# Patient Record
Sex: Female | Born: 1935 | Hispanic: Yes | Smoking: Never smoker
Health system: Southern US, Community
[De-identification: ages and names within clinical notes are randomized; demographics above are authoritative.]

## PROBLEM LIST (undated history)

## (undated) DIAGNOSIS — I10 Essential (primary) hypertension: Secondary | ICD-10-CM

## (undated) DIAGNOSIS — E785 Hyperlipidemia, unspecified: Secondary | ICD-10-CM

## (undated) DIAGNOSIS — K219 Gastro-esophageal reflux disease without esophagitis: Secondary | ICD-10-CM

## (undated) HISTORY — PX: ESOPHAGOGASTRODUODENOSCOPY: SHX1529

## (undated) HISTORY — PX: COLONOSCOPY: SHX174

## (undated) HISTORY — DX: Gastro-esophageal reflux disease without esophagitis: K21.9

## (undated) HISTORY — DX: Hyperlipidemia, unspecified: E78.5

---

## 1998-09-04 HISTORY — PX: VAGINA SURGERY: SHX829

## 2011-07-15 ENCOUNTER — Emergency Department (HOSPITAL_COMMUNITY): Payer: Medicare Other

## 2011-07-15 ENCOUNTER — Emergency Department (HOSPITAL_COMMUNITY)
Admission: EM | Admit: 2011-07-15 | Discharge: 2011-07-16 | Disposition: A | Discharge status: Home / Self Care | Payer: Medicare Other | Attending: Emergency Medicine | Admitting: Emergency Medicine

## 2011-07-15 ENCOUNTER — Encounter: Payer: Self-pay | Admitting: *Deleted

## 2011-07-15 DIAGNOSIS — I10 Essential (primary) hypertension: Secondary | ICD-10-CM | POA: Insufficient documentation

## 2011-07-15 DIAGNOSIS — M25469 Effusion, unspecified knee: Secondary | ICD-10-CM | POA: Insufficient documentation

## 2011-07-15 DIAGNOSIS — G8929 Other chronic pain: Secondary | ICD-10-CM | POA: Insufficient documentation

## 2011-07-15 DIAGNOSIS — W1789XA Other fall from one level to another, initial encounter: Secondary | ICD-10-CM | POA: Insufficient documentation

## 2011-07-15 DIAGNOSIS — M25473 Effusion, unspecified ankle: Secondary | ICD-10-CM | POA: Insufficient documentation

## 2011-07-15 DIAGNOSIS — M25476 Effusion, unspecified foot: Secondary | ICD-10-CM | POA: Insufficient documentation

## 2011-07-15 DIAGNOSIS — M25461 Effusion, right knee: Secondary | ICD-10-CM

## 2011-07-15 DIAGNOSIS — E7800 Pure hypercholesterolemia, unspecified: Secondary | ICD-10-CM | POA: Insufficient documentation

## 2011-07-15 HISTORY — DX: Essential (primary) hypertension: I10

## 2011-07-15 MED ORDER — OXYCODONE-ACETAMINOPHEN 5-325 MG PO TABS
1.0000 | ORAL_TABLET | Freq: Once | ORAL | Status: AC
  Administered 2011-07-15: 1 via ORAL

## 2011-07-15 MED ORDER — OXYCODONE-ACETAMINOPHEN 5-325 MG PO TABS
ORAL_TABLET | ORAL | Status: AC
  Filled 2011-07-15: qty 1

## 2011-07-15 NOTE — ED Notes (Signed)
Pt is here with right knee and bilateral heel pain for 7 months and then today fell because her right knee gave out.  No other injury.

## 2011-07-16 MED ORDER — OXYCODONE-ACETAMINOPHEN 5-325 MG PO TABS: 1.0000 | ORAL_TABLET | ORAL | Status: AC | PRN

## 2011-07-16 MED ORDER — OXYCODONE-ACETAMINOPHEN 5-325 MG PO TABS
1.0000 | ORAL_TABLET | Freq: Once | ORAL | Status: AC
  Administered 2011-07-16: 1 via ORAL
  Filled 2011-07-16: qty 1

## 2011-07-16 NOTE — ED Provider Notes (Signed)
We female with fall onto her right knee. Imaging without signs of fracture small joint effusion seen. Knee immobilizer acid, orthopedic referral made.  Is equal exam mild swelling of the right knee, no erythema, warmth or deformity. Normal neurovascular status in pulses to the right foot.  Assessment plan, orthopedic followup, immobilization, walker, anti-inflammatories. I discussed this plan with the family who have communicated to her by translation into her native language issues expressed her understanding.  Medical screening examination/treatment/procedure(s) were conducted as a shared visit with non-physician practitioner(s) and myself.  I personally evaluated the patient during the encounter   Vida Roller, MD 07/16/11 0127

## 2011-07-16 NOTE — ED Notes (Signed)
Family is at the bedside.  Knee immobilizer was applied.  Explained to family how to apply this and to have her wear it until she sees orthopedist

## 2011-07-16 NOTE — ED Provider Notes (Signed)
History     CSN: [id number] Arrival date & time: 07/15/2011  6:17 PM   First MD Initiated Contact with Patient 07/15/11 2338      Chief Complaint  Patient presents with  . Knee Injury    HPI:  Patient is a 76 y.o. female presenting with fall. The history is provided by a relative. The history is limited by a language barrier. A language interpreter was used.  Fall The accident occurred 6 to 12 hours ago. The fall occurred while standing. She fell from a height of 3 to 5 ft. She landed on carpet. The point of impact was the right knee.  Son who speaks very good english reports pt w/ long hx of chronic (R) knee pain. Has had mx cortisone injections for same. Tonight pt went rise from a chair and knee was painful and "gave out" causing pt to fall. Pt reports severe (R) knee pain.  Past Medical History  Diagnosis Date  . Hypertension   . Hypercholesteremia     History reviewed. No pertinent past surgical history.  No family history on file.  History  Substance Use Topics  . Smoking status: Never Smoker   . Smokeless tobacco: Not on file  . Alcohol Use: No    OB History    Grav Para Term Preterm Abortions TAB SAB Ect Mult Living                  Review of Systems  Constitutional: Negative.   HENT: Negative.   Eyes: Negative.   Respiratory: Negative.   Gastrointestinal: Negative.   Genitourinary: Negative.   Musculoskeletal: Positive for joint swelling and arthralgias.  Skin: Negative.   Neurological: Negative.   Hematological: Negative.   Psychiatric/Behavioral: Negative.     Allergies  Penicillins  Home Medications   Current Outpatient Rx  Name Route Sig Dispense Refill  . LOSARTAN POTASSIUM 100 MG PO TABS Oral Take 100 mg by mouth daily.        BP 156/58  Pulse 84  Temp(Src) 98.2 F (36.8 C) (Oral)  Resp 22  SpO2 96%  Physical Exam  Constitutional: She is oriented to person, place, and time. She appears well-developed and well-nourished.  HENT:   Head: Normocephalic and atraumatic.  Eyes: Conjunctivae are normal.  Neck: Normal range of motion.  Cardiovascular: Normal rate.   Pulmonary/Chest: Effort normal.  Musculoskeletal: Normal range of motion.       Right knee: She exhibits swelling and effusion. She exhibits no ecchymosis, no deformity, no laceration and no erythema. tenderness found.       Legs: Neurological: She is alert and oriented to person, place, and time. She has normal reflexes.  Skin: Skin is warm and dry.  Psychiatric: She has a normal mood and affect.    ED Course  Procedures Xr w/o acute findings. Pain has improved w/ medication. Knee immobilizer applied. Will provide Rx's for pain med, walker and ortho referral. Pt and family agreeable w/ plan.  Labs Reviewed - No data to display Dg Knee Complete 4 Views Right  07/15/2011  *RADIOLOGY REPORT*  Clinical Data: Right knee pain.  RIGHT KNEE - COMPLETE 4+ VIEW  Comparison: None  Findings: The joint spaces are fairly well maintained for age. Minimal degenerative changes.  No acute bony findings.  No osteochondral lesions.  Possible small joint effusion.  IMPRESSION: Minimal degenerative changes for age.  No acute bony findings.  Original Report Authenticated By: P. Loralie Champagne, M.D.  MDM    DG Knee Complete 4 Views Right (Final result)   Result time:07/15/11 2026    Final result by Rad Results In Interface (07/15/11 20:26:14)    Narrative:   *RADIOLOGY REPORT*  Clinical Data: Right knee pain.  RIGHT KNEE - COMPLETE 4+ VIEW  Comparison: None  Findings: The joint spaces are fairly well maintained for age. Minimal degenerative changes. No acute bony findings. No osteochondral lesions. Possible small joint effusion.  IMPRESSION: Minimal degenerative changes for age. No acute bony findings.  Original Report Authenticated By: P. Loralie Champagne, M.D           Leanne Chang, NP 07/16/11 [phone]

## 2011-07-31 ENCOUNTER — Other Ambulatory Visit: Payer: Self-pay | Admitting: Orthopedic Surgery

## 2011-07-31 DIAGNOSIS — M25561 Pain in right knee: Secondary | ICD-10-CM

## 2011-08-23 ENCOUNTER — Ambulatory Visit
Admission: RE | Admit: 2011-08-23 | Discharge: 2011-08-23 | Disposition: A | Discharge status: Home / Self Care | Payer: Medicare Other | Source: Ambulatory Visit | Attending: Orthopedic Surgery | Admitting: Orthopedic Surgery

## 2011-08-23 DIAGNOSIS — M25561 Pain in right knee: Secondary | ICD-10-CM

## 2012-06-04 DIAGNOSIS — R070 Pain in throat: Secondary | ICD-10-CM | POA: Insufficient documentation

## 2012-08-13 ENCOUNTER — Other Ambulatory Visit: Payer: Self-pay | Admitting: Otolaryngology

## 2012-08-13 DIAGNOSIS — R1310 Dysphagia, unspecified: Secondary | ICD-10-CM

## 2012-08-22 ENCOUNTER — Ambulatory Visit
Admission: RE | Admit: 2012-08-22 | Discharge: 2012-08-22 | Disposition: A | Discharge status: Home / Self Care | Payer: Medicare Other | Source: Ambulatory Visit | Attending: Otolaryngology | Admitting: Otolaryngology

## 2012-08-22 DIAGNOSIS — R1310 Dysphagia, unspecified: Secondary | ICD-10-CM

## 2012-09-05 DIAGNOSIS — I10 Essential (primary) hypertension: Secondary | ICD-10-CM | POA: Insufficient documentation

## 2012-09-05 DIAGNOSIS — E7800 Pure hypercholesterolemia, unspecified: Secondary | ICD-10-CM | POA: Insufficient documentation

## 2012-09-06 ENCOUNTER — Encounter: Payer: Self-pay | Admitting: *Deleted

## 2012-09-06 ENCOUNTER — Ambulatory Visit (INDEPENDENT_AMBULATORY_CARE_PROVIDER_SITE_OTHER): Payer: Medicare Other | Admitting: Cardiology

## 2012-09-06 ENCOUNTER — Encounter: Payer: Self-pay | Admitting: Cardiology

## 2012-09-06 VITALS — BP 124/70 | HR 81 | Wt 154.0 lb

## 2012-09-06 DIAGNOSIS — K219 Gastro-esophageal reflux disease without esophagitis: Secondary | ICD-10-CM | POA: Insufficient documentation

## 2012-09-06 DIAGNOSIS — E7800 Pure hypercholesterolemia, unspecified: Secondary | ICD-10-CM

## 2012-09-06 DIAGNOSIS — R002 Palpitations: Secondary | ICD-10-CM | POA: Insufficient documentation

## 2012-09-06 DIAGNOSIS — I10 Essential (primary) hypertension: Secondary | ICD-10-CM

## 2012-09-06 DIAGNOSIS — E785 Hyperlipidemia, unspecified: Secondary | ICD-10-CM | POA: Insufficient documentation

## 2012-09-06 LAB — TSH: TSH: 2.83 u[IU]/mL (ref 0.35–5.50)

## 2012-09-06 NOTE — Assessment & Plan Note (Signed)
Continue statin. Management per primary care. 

## 2012-09-06 NOTE — Progress Notes (Signed)
  HPI: 78 year old female with no prior cardiac history for evaluation of palpitations. Patient does not speak [place]. History is obtained through her son. She typically does not have dyspnea on exertion, orthopnea, PND, pedal edema, syncope or exertional chest pain. Over the past week she has had intermittent palpitations. They're described as her heart racing. This typically lasts 5 minutes and resolves spontaneously. No associated symptoms. Because of the above we were asked to evaluate.  Current Outpatient Prescriptions  Medication Sig Dispense Refill  . Esomeprazole Magnesium (NEXIUM PO) Take 1 tablet by mouth daily.      Marland Kitchen losartan (COZAAR) 100 MG tablet Take 100 mg by mouth daily.        . Rosuvastatin Calcium (CRESTOR PO) Take 1 tablet by mouth daily.        Allergies  Allergen Reactions  . Penicillins Nausea And Vomiting    Past Medical History  Diagnosis Date  . Hypertension   . Hyperlipidemia   . GERD (gastroesophageal reflux disease)     No past surgical history on file.  History   Social History  . Marital Status: Legally Separated    Spouse Name: N/A    Number of Children: 5  . Years of Education: N/A   Occupational History  .     Social History Main Topics  . Smoking status: Never Smoker   . Smokeless tobacco: Not on file  . Alcohol Use: No  . Drug Use: No  . Sexually Active:    Other Topics Concern  . Not on file   Social History Narrative  . No narrative on file    Family History  Problem Relation Age of Onset  . Heart disease      No family history    ROS: no fevers or chills, productive cough, hemoptysis, dysphasia, odynophagia, melena, hematochezia, dysuria, hematuria, rash, seizure activity, orthopnea, PND, pedal edema, claudication. Remaining systems are negative.  Physical Exam:   Blood pressure 124/70, pulse 81, weight 154 lb (69.854 kg).  General:  Well developed/well nourished in NAD Skin warm/dry Patient not depressed No  peripheral clubbing Back-normal HEENT-normal/normal eyelids Neck supple/normal carotid upstroke bilaterally; no bruits; no JVD; no thyromegaly chest - CTA/ normal expansion CV - RRR/normal S1 and S2; no rubs or gallops;  PMI nondisplaced, 1/6 systolic murmur left sternal border. Abdomen -NT/ND, no HSM, no mass, + bowel sounds, no bruit 2+ femoral pulses, no bruits Ext-no edema, chords, 2+ DP Neuro-grossly nonfocal  ECG 09/05/2012-sinus rhythm with no ST changes.

## 2012-09-06 NOTE — Patient Instructions (Addendum)
Your physician recommends that you schedule a follow-up appointment in: 6- 8 WEEKS WITH DR CRENSHAW  Your physician has requested that you have an echocardiogram. Echocardiography is a painless test that uses sound waves to create images of your heart. It provides your doctor with information about the size and shape of your heart and how well your heart's chambers and valves are working. This procedure takes approximately one hour. There are no restrictions for this procedure.   Your physician has recommended that you wear an event monitor. Event monitors are medical devices that record the heart's electrical activity. Doctors most often [place] these monitors to diagnose arrhythmias. Arrhythmias are problems with the speed or rhythm of the heartbeat. The monitor is a small, portable device. You can wear one while you do your normal daily activities. This is usually used to diagnose what is causing palpitations/syncope (passing out).   Your physician recommends that you HAVE LAB WORK TODAY

## 2012-09-06 NOTE — Assessment & Plan Note (Signed)
Etiology unclear. Check CardioNet. Check echocardiogram for LV function. Check TSH. If symptoms persist we will consider changing her Cozaar to her beta blocker to control both blood pressure and palpitations.

## 2012-09-06 NOTE — Assessment & Plan Note (Signed)
Blood pressure controlled. Continue present medications. 

## 2012-09-11 ENCOUNTER — Telehealth: Payer: Self-pay | Admitting: *Deleted

## 2012-09-11 NOTE — Telephone Encounter (Signed)
Message copied by Harriet Butte on Wed Sep 11, 2012  8:45 AM ------      Message from: MATHIS, DEBRA W      Created: Tue Sep 10, 2012  3:14 PM       This lady only speaks spanish! Can you please call her when you can and let her know her thyroid is normal based on the lab work we did last week. Thanks so much.

## 2012-09-11 NOTE — Telephone Encounter (Signed)
Called x 2 Phone sound busy.

## 2012-09-11 NOTE — Telephone Encounter (Signed)
Patient's son aware of lab work. Pt and son verbalized understanding.

## 2012-09-13 ENCOUNTER — Encounter: Payer: Self-pay | Admitting: Cardiology

## 2012-09-17 ENCOUNTER — Ambulatory Visit (HOSPITAL_COMMUNITY): Payer: Medicare Other | Attending: Cardiology

## 2012-09-17 ENCOUNTER — Encounter (INDEPENDENT_AMBULATORY_CARE_PROVIDER_SITE_OTHER): Payer: Medicare Other

## 2012-09-17 DIAGNOSIS — I10 Essential (primary) hypertension: Secondary | ICD-10-CM | POA: Insufficient documentation

## 2012-09-17 DIAGNOSIS — R002 Palpitations: Secondary | ICD-10-CM

## 2012-09-17 DIAGNOSIS — I379 Nonrheumatic pulmonary valve disorder, unspecified: Secondary | ICD-10-CM | POA: Insufficient documentation

## 2012-09-17 DIAGNOSIS — I369 Nonrheumatic tricuspid valve disorder, unspecified: Secondary | ICD-10-CM | POA: Insufficient documentation

## 2012-09-17 DIAGNOSIS — I059 Rheumatic mitral valve disease, unspecified: Secondary | ICD-10-CM | POA: Insufficient documentation

## 2012-09-17 NOTE — Progress Notes (Signed)
Echocardiogram performed.  

## 2012-09-17 NOTE — Progress Notes (Unsigned)
Placed a 30 event monitor . Went over instructions with her and her son, also went over instructions on when to return it

## 2012-10-22 ENCOUNTER — Telehealth: Payer: Self-pay | Admitting: *Deleted

## 2012-10-22 NOTE — Telephone Encounter (Signed)
Monitor reviewed by dr crenshaw shows sinus rhythm  

## 2012-10-23 NOTE — Telephone Encounter (Signed)
Pt and son are aware of monitor results. Son verbalized understanding

## 2012-11-01 ENCOUNTER — Encounter: Payer: Self-pay | Admitting: Cardiology

## 2012-11-01 ENCOUNTER — Ambulatory Visit (INDEPENDENT_AMBULATORY_CARE_PROVIDER_SITE_OTHER): Payer: Medicare Other | Admitting: Cardiology

## 2012-11-01 VITALS — BP 150/86 | HR 80 | Wt 152.0 lb

## 2012-11-01 DIAGNOSIS — I10 Essential (primary) hypertension: Secondary | ICD-10-CM

## 2012-11-01 DIAGNOSIS — E7800 Pure hypercholesterolemia, unspecified: Secondary | ICD-10-CM

## 2012-11-01 DIAGNOSIS — R002 Palpitations: Secondary | ICD-10-CM

## 2012-11-01 NOTE — Progress Notes (Signed)
   HPI: Pleasant female I initially saw in January of 2014 for palpitations. Echocardiogram in January of 2014 showed normal LV function, grade 1 diastolic dysfunction and mild mitral regurgitation. Event monitor in January of 2014 showed sinus rhythm. TSH 2.83. Since I last saw her, her palpitations have resolved. There is no chest pain, dyspnea or syncope.   Current Outpatient Prescriptions  Medication Sig Dispense Refill  . esomeprazole (NEXIUM) 40 MG capsule Take 40 mg by mouth daily before breakfast.      . losartan (COZAAR) 100 MG tablet Take 100 mg by mouth daily.        . rosuvastatin (CRESTOR) 20 MG tablet Take 20 mg by mouth daily.       No current facility-administered medications for this visit.     Past Medical History  Diagnosis Date  . Hypertension   . Hyperlipidemia   . GERD (gastroesophageal reflux disease)     History reviewed. No pertinent past surgical history.  History   Social History  . Marital Status: Legally Separated    Spouse Name: N/A    Number of Children: 5  . Years of Education: N/A   Occupational History  .     Social History Main Topics  . Smoking status: Never Smoker   . Smokeless tobacco: Not on file  . Alcohol Use: No  . Drug Use: No  . Sexually Active:    Other Topics Concern  . Not on file   Social History Narrative  . No narrative on file    ROS: no fevers or chills, productive cough, hemoptysis, dysphasia, odynophagia, melena, hematochezia, dysuria, hematuria, rash, seizure activity, orthopnea, PND, pedal edema, claudication. Remaining systems are negative.  Physical Exam: Well-developed well-nourished in no acute distress.  Skin is warm and dry.  HEENT is normal.  Neck is supple.  Chest is clear to auscultation with normal expansion.  Cardiovascular exam is regular rate and rhythm.  Abdominal exam nontender or distended. No masses palpated. Extremities show no edema. neuro grossly intact

## 2012-11-01 NOTE — Assessment & Plan Note (Signed)
Symptoms have resolved. Monitor and echocardiogram normal. TSH normal. We'll not proceed with further workup.

## 2012-11-01 NOTE — Assessment & Plan Note (Signed)
Management per primary care. 

## 2012-11-01 NOTE — Assessment & Plan Note (Signed)
Blood pressure is mildly elevated. She will continue her present medications and followup with her primary care physician. She may need additional medications in the future.

## 2012-11-01 NOTE — Patient Instructions (Addendum)
Your physician recommends that you schedule a follow-up appointment in: AS NEEDED  

## 2013-12-02 ENCOUNTER — Ambulatory Visit (INDEPENDENT_AMBULATORY_CARE_PROVIDER_SITE_OTHER): Payer: Medicare Other | Admitting: Podiatry

## 2013-12-02 ENCOUNTER — Ambulatory Visit (INDEPENDENT_AMBULATORY_CARE_PROVIDER_SITE_OTHER): Payer: Medicare Other

## 2013-12-02 ENCOUNTER — Encounter: Payer: Self-pay | Admitting: Podiatry

## 2013-12-02 VITALS — BP 161/67 | HR 70 | Resp 18 | Ht 61.0 in | Wt 150.0 lb

## 2013-12-02 DIAGNOSIS — M79609 Pain in unspecified limb: Secondary | ICD-10-CM

## 2013-12-02 DIAGNOSIS — M722 Plantar fascial fibromatosis: Secondary | ICD-10-CM

## 2013-12-02 MED ORDER — METHYLPREDNISOLONE (PAK) 4 MG PO TABS: ORAL_TABLET | ORAL | Status: DC

## 2013-12-02 NOTE — Progress Notes (Signed)
   Subjective:    Patient ID: Ariana Roberts, female    DOB: 1934/12/09, 79 y.o.   MRN: [id number]  HPI N heel pain        L B/L heel pain Left > right        D and O initially 2010 but occurred in January 2015 again        C mild, bothersome scratching pain        A walking         T injections received in 2010, pain pills Naproxen     Review of Systems  HENT: Negative.   Eyes: Negative.   Respiratory: Negative.   Cardiovascular: Negative.   Gastrointestinal: Negative.   Endocrine: Negative.   Genitourinary: Negative.   Musculoskeletal: Positive for myalgias.       Pt feels the statin medication was causing the left shoulder and left leg pain.  Skin: Negative.   Allergic/Immunologic: Negative.   Neurological: Negative.   Hematological: Negative.   Psychiatric/Behavioral: Negative.        Objective:   Physical Exam: I have reviewed her past medical history medications allergies surgeries and social history. Pulses are strongly palpable bilateral. Neurologic sensorium is intact per since once the monofilament. Degenerative flexor intact bilateral. Muscle strength is 5 over 5 dorsiflexors plantar flexors inverters everters all intrinsic musculature is intact. Orthopedic evaluation demonstrates all joints distal to the ankle a full range of motion without crepitation. His pain on palpation medial calcaneal tubercle of the bilateral heels. Radiographic evaluation does demonstrate soft tissue increase in density at the plantar fascial calcaneal insertion sites bilateral.        Assessment & Plan:  Assessment: Plantar fasciitis bilateral.  Plan: Injected the bilateral heels today with Kenalog and local anesthetic. Plantar fascial strapping was were applied. She was also provided with a prescription for prednisone.

## 2013-12-30 ENCOUNTER — Ambulatory Visit (INDEPENDENT_AMBULATORY_CARE_PROVIDER_SITE_OTHER): Payer: Medicare Other | Admitting: Podiatry

## 2013-12-30 ENCOUNTER — Encounter: Payer: Self-pay | Admitting: Podiatry

## 2013-12-30 VITALS — BP 157/84 | HR 69 | Resp 12

## 2013-12-30 DIAGNOSIS — M722 Plantar fascial fibromatosis: Secondary | ICD-10-CM

## 2013-12-30 NOTE — Progress Notes (Signed)
She presents today stating that she is doing a lot better still has a little pain and itching in her heels bilaterally.  Objective: Vital signs are stable she is alert and oriented x3. Pulses are palpable bilateral. Pain on palpation medial continued tubercle right greater than left.  Assessment: Plantar fasciitis bilateral.  Plan: Injection bilateral heels today plantar fascial strapping applied followup with me in one month

## 2014-02-12 ENCOUNTER — Encounter: Payer: Self-pay | Admitting: Podiatry

## 2014-02-12 ENCOUNTER — Ambulatory Visit (INDEPENDENT_AMBULATORY_CARE_PROVIDER_SITE_OTHER): Payer: Medicare Other | Admitting: Podiatry

## 2014-02-12 VITALS — BP 137/86 | HR 70 | Resp 16

## 2014-02-12 DIAGNOSIS — M722 Plantar fascial fibromatosis: Secondary | ICD-10-CM

## 2014-02-12 MED ORDER — MELOXICAM 15 MG PO TABS: 15.0000 mg | ORAL_TABLET | Freq: Every day | ORAL | Status: DC

## 2014-02-13 NOTE — Progress Notes (Signed)
She presents today for followup of her plantar fasciitis bilateral. She states that she is approximately 90% better.  Objective: Vital signs are stable she is alert and oriented x3. She has minimal pain on palpation medial continued tubercles bilateral.  Assessment: Plantar fasciitis bilateral.  Plan: Continue all conservative therapies and followup with me with recurrence.

## 2014-06-25 ENCOUNTER — Other Ambulatory Visit: Payer: Self-pay | Admitting: Podiatry

## 2015-07-26 ENCOUNTER — Other Ambulatory Visit: Payer: Self-pay | Admitting: Podiatry

## 2015-07-26 NOTE — Telephone Encounter (Signed)
Pt needs an appt

## 2016-02-07 ENCOUNTER — Other Ambulatory Visit: Payer: Self-pay | Admitting: Orthopedic Surgery

## 2016-02-11 NOTE — Progress Notes (Signed)
Called Dr. Diamantina Providenceean's office, spoke with Selena BattenKim, requested orders for upcoming surgery.

## 2016-02-11 NOTE — Progress Notes (Signed)
Attempted to call pt for same day workup call using [employer].  Interpreter ID [id number].  Called number listed x2, no answer, no message left.

## 2016-02-14 ENCOUNTER — Encounter (HOSPITAL_COMMUNITY): Payer: Self-pay | Admitting: General Practice

## 2016-02-14 ENCOUNTER — Other Ambulatory Visit (HOSPITAL_COMMUNITY): Payer: Self-pay | Admitting: General Practice

## 2016-02-14 MED ORDER — CLINDAMYCIN PHOSPHATE 900 MG/50ML IV SOLN
900.0000 mg | INTRAVENOUS | Status: AC
  Administered 2016-02-15: 900 mg via INTRAVENOUS
  Filled 2016-02-14: qty 50

## 2016-02-14 NOTE — Progress Notes (Signed)
Patient contacted with [employer], interpreter ID [phone].   Patient instructed to [facility] at 1130 on 02/15/16, to be NPO after midnight, and to take Prilosec the morning of surgery with a sip of water.  Patient denies chest pain and shortness of breath on pre-op phone call.

## 2016-02-15 ENCOUNTER — Ambulatory Visit (HOSPITAL_COMMUNITY): Payer: Medicare Other | Admitting: Anesthesiology

## 2016-02-15 ENCOUNTER — Observation Stay (HOSPITAL_COMMUNITY)
Admission: RE | Admit: 2016-02-15 | Discharge: 2016-02-16 | Disposition: A | Discharge status: Home Health | Payer: Medicare Other | Source: Ambulatory Visit | Attending: Orthopedic Surgery | Admitting: Orthopedic Surgery

## 2016-02-15 ENCOUNTER — Encounter (HOSPITAL_COMMUNITY): Payer: Self-pay | Admitting: *Deleted

## 2016-02-15 ENCOUNTER — Encounter (HOSPITAL_COMMUNITY)
Admission: RE | Disposition: A | Discharge status: Home Health | Payer: Self-pay | Source: Ambulatory Visit | Attending: Orthopedic Surgery

## 2016-02-15 DIAGNOSIS — Z880 Allergy status to penicillin: Secondary | ICD-10-CM | POA: Insufficient documentation

## 2016-02-15 DIAGNOSIS — R262 Difficulty in walking, not elsewhere classified: Secondary | ICD-10-CM | POA: Insufficient documentation

## 2016-02-15 DIAGNOSIS — K219 Gastro-esophageal reflux disease without esophagitis: Secondary | ICD-10-CM | POA: Diagnosis not present

## 2016-02-15 DIAGNOSIS — R531 Weakness: Secondary | ICD-10-CM | POA: Insufficient documentation

## 2016-02-15 DIAGNOSIS — M23304 Other meniscus derangements, unspecified medial meniscus, left knee: Principal | ICD-10-CM | POA: Insufficient documentation

## 2016-02-15 DIAGNOSIS — E785 Hyperlipidemia, unspecified: Secondary | ICD-10-CM | POA: Diagnosis not present

## 2016-02-15 DIAGNOSIS — I10 Essential (primary) hypertension: Secondary | ICD-10-CM | POA: Diagnosis not present

## 2016-02-15 DIAGNOSIS — S83249A Other tear of medial meniscus, current injury, unspecified knee, initial encounter: Secondary | ICD-10-CM | POA: Diagnosis present

## 2016-02-15 DIAGNOSIS — S83242A Other tear of medial meniscus, current injury, left knee, initial encounter: Secondary | ICD-10-CM | POA: Diagnosis present

## 2016-02-15 HISTORY — PX: KNEE ARTHROSCOPY WITH MEDIAL MENISECTOMY: SHX5651

## 2016-02-15 LAB — BASIC METABOLIC PANEL
Anion gap: 7 (ref 5–15)
BUN: 17 mg/dL (ref 6–20)
CALCIUM: 9.8 mg/dL (ref 8.9–10.3)
CO2: 27 mmol/L (ref 22–32)
CREATININE: 0.93 mg/dL (ref 0.44–1.00)
Chloride: 105 mmol/L (ref 101–111)
GFR calc Af Amer: >60 mL/min (ref >60)
GFR, EST NON AFRICAN AMERICAN: 57 mL/min — AB (ref >60)
GLUCOSE: 89 mg/dL (ref 65–99)
Potassium: 4.8 mmol/L (ref 3.5–5.1)
SODIUM: 139 mmol/L (ref 135–145)

## 2016-02-15 LAB — CBC
HEMATOCRIT: 40.8 % (ref 36.0–46.0)
Hemoglobin: 12.9 g/dL (ref 12.0–15.0)
MCH: 26.3 pg (ref 26.0–34.0)
MCHC: 31.6 g/dL (ref 30.0–36.0)
MCV: 83.1 fL (ref 78.0–100.0)
Platelets: 212 10*3/uL (ref 150–400)
RBC: 4.91 MIL/uL (ref 3.87–5.11)
RDW: 13.4 % (ref 11.5–15.5)
WBC: 9.1 10*3/uL (ref 4.0–10.5)

## 2016-02-15 SURGERY — ARTHROSCOPY, KNEE, WITH MEDIAL MENISCECTOMY
Anesthesia: General | Site: Knee | Laterality: Left

## 2016-02-15 MED ORDER — NEOSTIGMINE METHYLSULFATE 5 MG/5ML IV SOSY
PREFILLED_SYRINGE | INTRAVENOUS | Status: AC
  Filled 2016-02-15: qty 5

## 2016-02-15 MED ORDER — PROPOFOL 10 MG/ML IV BOLUS
INTRAVENOUS | Status: DC | PRN
  Administered 2016-02-15: 80 mg via INTRAVENOUS

## 2016-02-15 MED ORDER — BUPIVACAINE-EPINEPHRINE (PF) 0.25% -1:200000 IJ SOLN
INTRAMUSCULAR | Status: AC
  Filled 2016-02-15: qty 30

## 2016-02-15 MED ORDER — MORPHINE SULFATE (PF) 4 MG/ML IV SOLN
INTRAVENOUS | Status: DC | PRN
  Administered 2016-02-15: 4 mg via INTRAVENOUS

## 2016-02-15 MED ORDER — SUGAMMADEX SODIUM 200 MG/2ML IV SOLN
INTRAVENOUS | Status: DC | PRN
  Administered 2016-02-15: 200 mg via INTRAVENOUS

## 2016-02-15 MED ORDER — CHLORHEXIDINE GLUCONATE 4 % EX LIQD: 60.0000 mL | Freq: Once | CUTANEOUS | Status: DC

## 2016-02-15 MED ORDER — ROCURONIUM BROMIDE 100 MG/10ML IV SOLN
INTRAVENOUS | Status: DC | PRN
  Administered 2016-02-15: 30 mg via INTRAVENOUS

## 2016-02-15 MED ORDER — ONDANSETRON HCL 4 MG/2ML IJ SOLN
INTRAMUSCULAR | Status: DC | PRN
  Administered 2016-02-15: 4 mg via INTRAVENOUS

## 2016-02-15 MED ORDER — METOCLOPRAMIDE HCL 5 MG/ML IJ SOLN
5.0000 mg | Freq: Three times a day (TID) | INTRAMUSCULAR | Status: DC | PRN
  Administered 2016-02-16 (×2): 10 mg via INTRAVENOUS
  Filled 2016-02-15 (×2): qty 2

## 2016-02-15 MED ORDER — BUPIVACAINE HCL (PF) 0.25 % IJ SOLN
INTRAMUSCULAR | Status: DC | PRN
Start: 2016-02-15 — End: 2016-02-15
  Administered 2016-02-15: 30 mL

## 2016-02-15 MED ORDER — ONDANSETRON HCL 4 MG/2ML IJ SOLN
4.0000 mg | Freq: Four times a day (QID) | INTRAMUSCULAR | Status: DC | PRN
  Administered 2016-02-15 – 2016-02-16 (×2): 4 mg via INTRAVENOUS
  Filled 2016-02-15 (×2): qty 2

## 2016-02-15 MED ORDER — DIPHENHYDRAMINE HCL 25 MG PO TABS
25.0000 mg | ORAL_TABLET | Freq: Four times a day (QID) | ORAL | Status: DC | PRN
  Filled 2016-02-15: qty 1

## 2016-02-15 MED ORDER — LACTATED RINGERS IV SOLN
INTRAVENOUS | Status: DC
  Administered 2016-02-15 (×2): via INTRAVENOUS

## 2016-02-15 MED ORDER — PANTOPRAZOLE SODIUM 40 MG PO TBEC
40.0000 mg | DELAYED_RELEASE_TABLET | Freq: Every day | ORAL | Status: DC
  Administered 2016-02-16: 40 mg via ORAL
  Filled 2016-02-15 (×2): qty 1

## 2016-02-15 MED ORDER — ONDANSETRON HCL 4 MG/2ML IJ SOLN
INTRAMUSCULAR | Status: AC
  Filled 2016-02-15: qty 4

## 2016-02-15 MED ORDER — ACETAMINOPHEN 325 MG PO TABS: 650.0000 mg | ORAL_TABLET | Freq: Four times a day (QID) | ORAL | Status: DC | PRN

## 2016-02-15 MED ORDER — ACETAMINOPHEN 650 MG RE SUPP: 650.0000 mg | Freq: Four times a day (QID) | RECTAL | Status: DC | PRN

## 2016-02-15 MED ORDER — LOSARTAN POTASSIUM-HCTZ 100-25 MG PO TABS: 1.0000 | ORAL_TABLET | Freq: Every day | ORAL | Status: DC

## 2016-02-15 MED ORDER — FENTANYL CITRATE (PF) 250 MCG/5ML IJ SOLN
INTRAMUSCULAR | Status: AC
  Filled 2016-02-15: qty 5

## 2016-02-15 MED ORDER — ONDANSETRON HCL 4 MG PO TABS: 4.0000 mg | ORAL_TABLET | Freq: Four times a day (QID) | ORAL | Status: DC | PRN

## 2016-02-15 MED ORDER — FENTANYL CITRATE (PF) 100 MCG/2ML IJ SOLN
INTRAMUSCULAR | Status: DC | PRN
  Administered 2016-02-15 (×2): 50 ug via INTRAVENOUS

## 2016-02-15 MED ORDER — HYDROCODONE-ACETAMINOPHEN 5-325 MG PO TABS
1.0000 | ORAL_TABLET | Freq: Four times a day (QID) | ORAL | Status: DC | PRN
  Administered 2016-02-16: 1 via ORAL
  Filled 2016-02-15: qty 1

## 2016-02-15 MED ORDER — CLONIDINE HCL (ANALGESIA) 100 MCG/ML EP SOLN
EPIDURAL | Status: DC | PRN
  Administered 2016-02-15: .8 mL

## 2016-02-15 MED ORDER — METOCLOPRAMIDE HCL 5 MG PO TABS: 5.0000 mg | ORAL_TABLET | Freq: Three times a day (TID) | ORAL | Status: DC | PRN

## 2016-02-15 MED ORDER — EZETIMIBE 10 MG PO TABS
10.0000 mg | ORAL_TABLET | Freq: Every day | ORAL | Status: DC
  Administered 2016-02-16: 10 mg via ORAL
  Filled 2016-02-15 (×2): qty 1

## 2016-02-15 MED ORDER — MORPHINE SULFATE (PF) 4 MG/ML IV SOLN
INTRAVENOUS | Status: AC
  Filled 2016-02-15: qty 2

## 2016-02-15 MED ORDER — LOSARTAN POTASSIUM 50 MG PO TABS
100.0000 mg | ORAL_TABLET | Freq: Every day | ORAL | Status: DC
  Administered 2016-02-16: 100 mg via ORAL
  Filled 2016-02-15: qty 2

## 2016-02-15 MED ORDER — SODIUM CHLORIDE 0.9 % IR SOLN
Status: DC | PRN
  Administered 2016-02-15: 1000 mL

## 2016-02-15 MED ORDER — CELECOXIB 200 MG PO CAPS
200.0000 mg | ORAL_CAPSULE | Freq: Two times a day (BID) | ORAL | Status: DC
  Administered 2016-02-16: 200 mg via ORAL
  Filled 2016-02-15 (×2): qty 1

## 2016-02-15 MED ORDER — PROPOFOL 10 MG/ML IV BOLUS
INTRAVENOUS | Status: AC
  Filled 2016-02-15: qty 20

## 2016-02-15 MED ORDER — GLYCOPYRROLATE 0.2 MG/ML IV SOSY
PREFILLED_SYRINGE | INTRAVENOUS | Status: AC
  Filled 2016-02-15: qty 3

## 2016-02-15 MED ORDER — POTASSIUM CHLORIDE IN NACL 20-0.9 MEQ/L-% IV SOLN
INTRAVENOUS | Status: DC
  Administered 2016-02-16: 01:00:00 via INTRAVENOUS
  Filled 2016-02-15: qty 1000

## 2016-02-15 MED ORDER — LIDOCAINE HCL (CARDIAC) 20 MG/ML IV SOLN
INTRAVENOUS | Status: DC | PRN
  Administered 2016-02-15: 60 mg via INTRAVENOUS

## 2016-02-15 MED ORDER — HYDROCHLOROTHIAZIDE 25 MG PO TABS
25.0000 mg | ORAL_TABLET | Freq: Every day | ORAL | Status: DC
  Administered 2016-02-16: 25 mg via ORAL
  Filled 2016-02-15: qty 1

## 2016-02-15 MED ORDER — FENTANYL CITRATE (PF) 100 MCG/2ML IJ SOLN: 25.0000 ug | INTRAMUSCULAR | Status: DC | PRN

## 2016-02-15 MED ORDER — ASPIRIN 325 MG PO TABS
325.0000 mg | ORAL_TABLET | Freq: Every day | ORAL | Status: DC
  Administered 2016-02-16: 325 mg via ORAL
  Filled 2016-02-15 (×2): qty 1

## 2016-02-15 SURGICAL SUPPLY — 56 items
ALCOHOL 70% 16 OZ (MISCELLANEOUS) ×3 IMPLANT
BANDAGE ELASTIC 6 VELCRO ST LF (GAUZE/BANDAGES/DRESSINGS) IMPLANT
BANDAGE ESMARK 6X9 LF (GAUZE/BANDAGES/DRESSINGS) IMPLANT
BLADE CUTTER GATOR 3.5 (BLADE) ×3 IMPLANT
BLADE GREAT WHITE 4.2 (BLADE) IMPLANT
BLADE GREAT WHITE 4.2MM (BLADE)
BLADE SURG ROTATE 9660 (MISCELLANEOUS) IMPLANT
BNDG ESMARK 6X9 LF (GAUZE/BANDAGES/DRESSINGS)
COVER SURGICAL LIGHT HANDLE (MISCELLANEOUS) ×3 IMPLANT
CUFF TOURNIQUET SINGLE 34IN LL (TOURNIQUET CUFF) ×3 IMPLANT
CUFF TOURNIQUET SINGLE 44IN (TOURNIQUET CUFF) IMPLANT
DRAPE ARTHROSCOPY W/POUCH 114 (DRAPES) ×3 IMPLANT
DRAPE INCISE IOBAN 66X45 STRL (DRAPES) IMPLANT
DRAPE PROXIMA HALF (DRAPES) IMPLANT
DRAPE U-SHAPE 47X51 STRL (DRAPES) ×3 IMPLANT
DRSG TEGADERM 2-3/8X2-3/4 SM (GAUZE/BANDAGES/DRESSINGS) ×3 IMPLANT
DURAPREP 26ML APPLICATOR (WOUND CARE) ×3 IMPLANT
GAUZE SPONGE 4X4 12PLY STRL (GAUZE/BANDAGES/DRESSINGS) IMPLANT
GAUZE XEROFORM 1X8 LF (GAUZE/BANDAGES/DRESSINGS) ×3 IMPLANT
GLOVE BIO SURGEON STRL SZ 6.5 (GLOVE) ×2 IMPLANT
GLOVE BIO SURGEONS STRL SZ 6.5 (GLOVE) ×1
GLOVE BIOGEL PI IND STRL 7.5 (GLOVE) ×1 IMPLANT
GLOVE BIOGEL PI IND STRL 8 (GLOVE) ×1 IMPLANT
GLOVE BIOGEL PI INDICATOR 7.5 (GLOVE) ×2
GLOVE BIOGEL PI INDICATOR 8 (GLOVE) ×2
GLOVE ECLIPSE 7.0 STRL STRAW (GLOVE) ×3 IMPLANT
GLOVE SURG ORTHO 8.0 STRL STRW (GLOVE) ×3 IMPLANT
GOWN STRL REUS W/ TWL LRG LVL3 (GOWN DISPOSABLE) ×2 IMPLANT
GOWN STRL REUS W/ TWL XL LVL3 (GOWN DISPOSABLE) ×1 IMPLANT
GOWN STRL REUS W/TWL LRG LVL3 (GOWN DISPOSABLE) ×4
GOWN STRL REUS W/TWL XL LVL3 (GOWN DISPOSABLE) ×2
KIT BASIN OR (CUSTOM PROCEDURE TRAY) ×3 IMPLANT
KIT ROOM TURNOVER OR (KITS) ×3 IMPLANT
MANIFOLD NEPTUNE II (INSTRUMENTS) IMPLANT
NEEDLE 18GX1X1/2 (RX/OR ONLY) (NEEDLE) IMPLANT
NEEDLE 22X1 1/2 (OR ONLY) (NEEDLE) ×3 IMPLANT
NS IRRIG 1000ML POUR BTL (IV SOLUTION) IMPLANT
PACK ARTHROSCOPY DSU (CUSTOM PROCEDURE TRAY) ×3 IMPLANT
PAD ARMBOARD 7.5X6 YLW CONV (MISCELLANEOUS) ×6 IMPLANT
PADDING CAST COTTON 6X4 STRL (CAST SUPPLIES) ×3 IMPLANT
SET ARTHROSCOPY TUBING (MISCELLANEOUS) ×2
SET ARTHROSCOPY TUBING LN (MISCELLANEOUS) ×1 IMPLANT
SOL PREP POV-IOD 4OZ 10% (MISCELLANEOUS) ×3 IMPLANT
SPONGE LAP 4X18 X RAY DECT (DISPOSABLE) ×3 IMPLANT
SUT ETHILON 3 0 PS 1 (SUTURE) IMPLANT
SUT MENISCAL KIT (KITS) IMPLANT
SYR 20ML ECCENTRIC (SYRINGE) ×3 IMPLANT
SYR CONTROL 10ML LL (SYRINGE) IMPLANT
SYR TB 1ML LUER SLIP (SYRINGE) ×3 IMPLANT
TOWEL OR 17X24 6PK STRL BLUE (TOWEL DISPOSABLE) ×3 IMPLANT
TOWEL OR 17X26 10 PK STRL BLUE (TOWEL DISPOSABLE) ×3 IMPLANT
TUBE CONNECTING 12'X1/4 (SUCTIONS) ×1
TUBE CONNECTING 12X1/4 (SUCTIONS) ×2 IMPLANT
WAND 90 DEG TURBOVAC W/CORD (SURGICAL WAND) IMPLANT
WAND HAND CNTRL MULTIVAC 90 (MISCELLANEOUS) ×3 IMPLANT
WATER STERILE IRR 1000ML POUR (IV SOLUTION) ×3 IMPLANT

## 2016-02-15 NOTE — Anesthesia Postprocedure Evaluation (Signed)
Anesthesia Post Note  Patient: Ariana Roberts  Procedure(s) Performed: Procedure(s) (LRB): LEFT KNEE ARTHROSCOPY WITH MEDIAL MENISECTOMY (Left)  Patient location during evaluation: PACU Anesthesia Type: General Level of consciousness: awake and alert Pain management: pain level controlled Vital Signs Assessment: post-procedure vital signs reviewed and stable Respiratory status: spontaneous breathing, nonlabored ventilation and respiratory function stable Cardiovascular status: blood pressure returned to baseline and stable Postop Assessment: no signs of nausea or vomiting Anesthetic complications: no    Last Vitals:  Filed Vitals:   02/15/16 1753 02/15/16 1807  BP: 132/87 135/60  Pulse: 70 62  Temp:    Resp: 17 19    Last Pain:  Filed Vitals:   02/15/16 1808  PainSc: 2                  Dashanna Kinnamon,W. EDMOND

## 2016-02-15 NOTE — Progress Notes (Signed)
Pt could not be placed in CPM in PACU because she was on a stretcher. Ortho tech paged upon arrival to floor. Channing MuttersRoy will be up  to place CPM.

## 2016-02-15 NOTE — H&P (Addendum)
Ariana Roberts is an 81 y.o. female.   Chief Complaint: left knee pain HPI: Ariana Roberts is a 81 year old patient with long history of left knee pain.  She has no arthritis on plain radiographs but MRI scan shows medial meniscal tear.  She is failed conservative management including injection and medication activity.  She really can't go on with the knee the way it is.  She does not have a stress fracture by MRI scanning.  Past Medical History  Diagnosis Date  . Hypertension   . Hyperlipidemia   . GERD (gastroesophageal reflux disease)     Past Surgical History  Procedure Laterality Date  . Vagina surgery  2000  . Colonoscopy    . Esophagogastroduodenoscopy      Family History  Problem Relation Age of Onset  . Heart disease      No family history   Social History:  reports that she has never smoked. She does not have any smokeless tobacco history on file. She reports that she does not drink alcohol or use illicit drugs.  Allergies:  Allergies  Allergen Reactions  . Penicillins Nausea And Vomiting    Has patient had a PCN reaction causing immediate rash, facial/tongue/throat swelling, SOB or lightheadedness with hypotension: No Has patient had a PCN reaction causing severe rash involving mucus membranes or skin necrosis: No Has patient had a PCN reaction that required hospitalization No Has patient had a PCN reaction occurring within the last 10 years: No If all of the above answers are "NO", then may proceed with Cephalosporin use.     No prescriptions prior to admission    No results found for this or any previous visit (from the past 48 hour(s)). No results found.  Review of Systems  Constitutional: Negative.   HENT: Negative.   Eyes: Negative.   Respiratory: Negative.   Cardiovascular: Negative.   Gastrointestinal: Negative.   Genitourinary: Negative.   Musculoskeletal: Positive for joint pain.  Skin: Negative.   Neurological: Negative.   Endo/Heme/Allergies:  Negative.   Psychiatric/Behavioral: Negative.     There were no vitals taken for this visit. Physical Exam  Constitutional: She appears well-developed.  HENT:  Head: Normocephalic.  Eyes: Pupils are equal, round, and reactive to light.  Neck: Normal range of motion.  Cardiovascular: Normal rate.   Neurological: She is alert.  Skin: Skin is warm.  Psychiatric: She has a normal mood and affect.   examination the left knee demonstrates an effusion medial joint line tenderness palpable pedal pulses intact since mechanism stable collateral crucial ligaments no groin pain interlocks rotation leg no other masses limitations incision of the left knee region  Assessment/Plan Impression is left knee medial meniscal tear by MRI scanning with no evidence of stress fracture or significant arthritis in the knee.  Patient's failed conservative management and presents now for operative management after expiration risks and benefits.  The nature of surgery in this patient population is discussed with the patient and her son.  Unpredictable results with arthroscopy and meniscal work is a definite possibility in this particular patient.  Incomplete pain relief is the rule and not the exception.  Patient understands the risks and benefits of surgery wishes to proceed.  All questions answered  Cammy CopaEAN,York Valliant SCOTT, MD 02/15/2016, 7:37 AM

## 2016-02-15 NOTE — Brief Op Note (Signed)
02/15/2016  5:17 PM  PATIENT:  Dewayne ShorterAna Baus  81 y.o. female  PRE-OPERATIVE DIAGNOSIS:  LEFT KNEE MEDIAL MENISCAL TEAR  POST-OPERATIVE DIAGNOSIS:  LEFT KNEE MEDIAL MENISCAL TEAR  PROCEDURE:  Procedure(s): LEFT KNEE ARTHROSCOPY WITH MEDIAL MENISECTOMY  SURGEON:  Surgeon(s): Cammy CopaScott Emmanuell Kantz, MD  ASSISTANT: Patrick Jupiterarla Bethune rnfa  ANESTHESIA:   general  EBL: 3 ml       BLOOD ADMINISTERED: none  DRAINS: none   LOCAL MEDICATIONS USED:  Marcaine mso4 clonodine  SPECIMEN:  No Specimen  COUNTS:  YES  TOURNIQUET:    DICTATION: .Other Dictation: Dictation Number [id number]  PLAN OF CARE: Admit for overnight observation  PATIENT DISPOSITION:  PACU - hemodynamically stable

## 2016-02-15 NOTE — Anesthesia Procedure Notes (Signed)
Procedure Name: Intubation Date/Time: 02/15/2016 4:24 PM Performed by: Marena ChancyBECKNER, Aren Pryde S Pre-anesthesia Checklist: Emergency Drugs available, Patient identified, Timeout performed, Suction available and Patient being monitored Patient Re-evaluated:Patient Re-evaluated prior to inductionOxygen Delivery Method: Circle system utilized Preoxygenation: Pre-oxygenation with 100% oxygen Intubation Type: IV induction Ventilation: Mask ventilation without difficulty and Oral airway inserted - appropriate to patient size Laryngoscope Size: Mac and 3 Grade View: Grade I Tube type: Oral Tube size: 7.0 mm Number of attempts: 1 Placement Confirmation: ETT inserted through vocal cords under direct vision,  positive ETCO2 and breath sounds checked- equal and bilateral Tube secured with: Tape Dental Injury: Teeth and Oropharynx as per pre-operative assessment

## 2016-02-15 NOTE — Progress Notes (Signed)
Full dentures ret to pt by son

## 2016-02-15 NOTE — Anesthesia Preprocedure Evaluation (Signed)
Anesthesia Evaluation  Patient identified by MRN, date of birth, ID band Patient awake    Reviewed: Allergy & Precautions, H&P , NPO status , Patient's Chart, lab work & pertinent test results  Airway Mallampati: II  TM Distance: >3 FB Neck ROM: Full    Dental no notable dental hx. (+) Upper Dentures, Lower Dentures, Dental Advisory Given   Pulmonary neg pulmonary ROS,    Pulmonary exam normal breath sounds clear to auscultation       Cardiovascular hypertension, Pt. on medications  Rhythm:Regular Rate:Normal     Neuro/Psych negative neurological ROS  negative psych ROS   GI/Hepatic Neg liver ROS, GERD  Medicated and Controlled,  Endo/Other  negative endocrine ROS  Renal/GU negative Renal ROS  negative genitourinary   Musculoskeletal   Abdominal   Peds  Hematology negative hematology ROS (+)   Anesthesia Other Findings   Reproductive/Obstetrics negative OB ROS                             Anesthesia Physical Anesthesia Plan  ASA: II  Anesthesia Plan: General   Post-op Pain Management:    Induction: Intravenous  Airway Management Planned: LMA and Oral ETT  Additional Equipment:   Intra-op Plan:   Post-operative Plan: Extubation in OR  Informed Consent: I have reviewed the patients History and Physical, chart, labs and discussed the procedure including the risks, benefits and alternatives for the proposed anesthesia with the patient or authorized representative who has indicated his/her understanding and acceptance.   Dental advisory given  Plan Discussed with: CRNA  Anesthesia Plan Comments:         Anesthesia Quick Evaluation

## 2016-02-15 NOTE — Progress Notes (Signed)
Orthopedic Tech Progress Note Patient Details:  Ariana Roberts 11/15/1934 [id number] Applied CPM to LLE. CPM Left Knee CPM Left Knee: On Left Knee Flexion (Degrees): 60 Left Knee Extension (Degrees): 0   Lesle ChrisGilliland, Karon Heckendorn L 02/15/2016, 7:39 PM

## 2016-02-15 NOTE — Transfer of Care (Signed)
Immediate Anesthesia Transfer of Care Note  Patient: Ariana ShorterAna Roberts  Procedure(s) Performed: Procedure(s) with comments: LEFT KNEE ARTHROSCOPY WITH MEDIAL MENISECTOMY (Left) - LEFT KNEE DIAGNOSTIC OPERATIVE ARHTROSCOPY, DEBRIDEMENT.  Patient Location: PACU  Anesthesia Type:General  Level of Consciousness: awake, alert , oriented and patient cooperative  Airway & Oxygen Therapy: Patient Spontanous Breathing and Patient connected to nasal cannula oxygen  Post-op Assessment: Report given to RN, Post -op Vital signs reviewed and stable and Patient moving all extremities  Post vital signs: Reviewed and stable  Last Vitals:  Filed Vitals:   02/15/16 1134  BP: 187/51  Pulse: 76  Temp: 36.9 C  Resp: 18    Last Pain: There were no vitals filed for this visit.    Patients Stated Pain Goal: 4 (02/15/16 1334)  Complications: No apparent anesthesia complications

## 2016-02-15 NOTE — Op Note (Signed)
Ariana Roberts:  Ariana Roberts, Ariana Roberts                  ACCOUNT NO.:  [id number]  MEDICAL RECORD NO.:  [id number]  LOCATION:  [id number]                        FACILITY:  [facility]  PHYSICIAN:  Burnard BuntingG. Scott Dean, M.D.    DATE OF BIRTH:  1934-11-12  DATE OF PROCEDURE: DATE OF DISCHARGE:                              OPERATIVE REPORT   PREOPERATIVE DIAGNOSIS:  Left knee medial meniscal tear.  POSTOPERATIVE DIAGNOSIS:  Left knee medial meniscal tear.  PROCEDURE:  Left knee arthroscopy with partial posterior medial meniscectomy.  SURGEON:  Burnard BuntingG. Scott Dean, M.D.  ASSISTANT:  Patrick Jupiterarla Bethune, RNFA.  INDICATIONS:  Ariana Roberts is a patient with left knee pain refractory to nonoperative management.  Has MRI scan, evidence of medial meniscal tear, no evidence of stress fracture or stress reaction, has focal medial pain, presents now for operative management after explanation of risks and benefits.  PROCEDURE IN DETAIL:  The patient was brought to the operating room where general anesthetic was induced.  Preoperative antibiotics were administered.  Time-out was called.  The patient's left knee was examined under anesthesia, found to have full range of motion, stable collateral and cruciate ligaments, full range of motion.  No posterolateral rotatory instability.  Time-out was called.  Left leg was prescrubbed with alcohol and Betadine, allowed to air dry, prepped with DuraPrep solution and draped in a sterile manner.  Anterior inferolateral portals were established.  Anterior inferomedial portals were established under direct visualization.  Diagnostic arthroscopy was performed.  The patient had intact lateral compartment, articular cartilage and meniscus, intact patellofemoral compartment, intact ACL, PCL.  Some synovitis present in the joint.  The patient did have a pretty significant medial meniscal tear, but with intact articular cartilage on the femur and tibia.  The meniscal tear involved about 90% anterior-posterior width  of the meniscus.  It was debrided back to stable rims with combination of basket, punch and shavers.  Thorough irrigation was then performed, instruments were removed.  Portals were closed using 3-0 nylon.  Solution of Marcaine, morphine, clonidine injected into the knee.  Ace wrap applied.  The patient will be admitted 23-hour observation for therapy and ambulation, weightbearing as tolerated.     Burnard BuntingG. Scott Dean, M.D.     GSD/MEDQ  D:  02/15/2016  T:  02/15/2016  Job:  [id number]

## 2016-02-16 ENCOUNTER — Encounter (HOSPITAL_COMMUNITY): Payer: Self-pay | Admitting: Orthopedic Surgery

## 2016-02-16 DIAGNOSIS — M23304 Other meniscus derangements, unspecified medial meniscus, left knee: Secondary | ICD-10-CM | POA: Diagnosis not present

## 2016-02-16 MED ORDER — HYDROCODONE-ACETAMINOPHEN 5-325 MG PO TABS: 1.0000 | ORAL_TABLET | Freq: Four times a day (QID) | ORAL | Status: DC | PRN

## 2016-02-16 MED ORDER — ASPIRIN 325 MG PO TABS: 325.0000 mg | ORAL_TABLET | Freq: Every day | ORAL | Status: DC

## 2016-02-16 NOTE — Care Management Obs Status (Signed)
MEDICARE OBSERVATION STATUS NOTIFICATION   Patient Details  Name: Ariana Roberts MRN: [id number] Date of Birth: Mar 25, 1935   Medicare Observation Status Notification Given:  Yes    Durenda GuthrieBrady, Tilton Marsalis Naomi, RN 02/16/2016, 4:01 PM

## 2016-02-16 NOTE — Care Management Note (Signed)
Case Management Note  Patient Details  Name: Ariana Roberts MRN: [id number] Date of Birth: [birth year]  Subjective/Objective:   81 yr old female s/p left knee arthroscopy, with meniscectomy                 Action/Plan: Case manager spoke with patient [name] concerning home health and DME needs. Choice was offered for Home Health Agencies. Referral was called to Tamala BariMary Manley, [facility] . Patient resides with her son, will have family support at discharge.    Expected Discharge Date:   02/16/16               Expected Discharge Plan:   home with Home Health  In-House Referral:     Discharge planning Services  CM Consult  Post Acute Care Choice:  Durable Medical Equipment, Home Health Choice offered to:  Patient, Adult Children  DME Arranged:  Walker rolling DME Agency:  [facility]  [facility]  Status of Service:  Completed, signed off  Medicare Important Message Given:    Date Medicare IM Given:    Medicare IM give by:    Date Additional Medicare IM Given:    Additional Medicare Important Message give by:     If discussed at Long Length of Stay Meetings, dates discussed:    Additional Comments:  Durenda GuthrieBrady, Gerardo Caiazzo Naomi, RN 02/16/2016, 11:44 AM

## 2016-02-16 NOTE — Evaluation (Signed)
Physical Therapy Evaluation Patient Details Name: Ariana Roberts MRN: [id number] DOB: [birth year] Today's Date: 02/16/2016   History of Present Illness  Antony Contrasnna Auble is a 81 year old patient with long history of left knee pain. She has no arthritis on plain radiographs but MRI scan shows medial meniscal tear. PMH: GERD, HTN  Clinical Impression  Patient is s/p above surgery resulting in functional limitations due to the deficits listed below (see PT Problem List).  Patient will benefit from skilled PT to increase their independence and safety with mobility to allow discharge to the venue listed below.  Pt at supervision/ min-guard A level, ambulated 9075' with RW. Son seems somewhat uncomfortable with motivating pt to mobilize and he reports that their relationship is somewhat strained. She would benefit from staying the night and having one more session of therapy tomorrow. But if this is not possible, recommend home with HHPT this afternoon. It would also be helpful to have CPM at home that she could be in while son is working.      Follow Up Recommendations Home health PT    Equipment Recommendations  Rolling walker with 5" wheels (youth height)    Recommendations for Other Services       Precautions / Restrictions Precautions Precautions: Fall Restrictions Weight Bearing Restrictions: Yes LLE Weight Bearing: Weight bearing as tolerated      Mobility  Bed Mobility Overal bed mobility: Needs Assistance Bed Mobility: Supine to Sit     Supine to sit: Supervision     General bed mobility comments: pt able to get to EOB with supervision and vc's for sequencing. Encouraged her son to let her do as much as she can on her own at home  Transfers Overall transfer level: Needs assistance Equipment used: Rolling walker (2 wheeled) Transfers: Sit to/from Stand Sit to Stand: Min guard         General transfer comment: vc's for hand placement, min-guard A for  safety  Ambulation/Gait Ambulation/Gait assistance: Min guard Ambulation Distance (Feet): 75 Feet Assistive device: Rolling walker (2 wheeled) Gait Pattern/deviations: Step-through pattern;Decreased step length - left;Decreased weight shift to left Gait velocity: decreased Gait velocity interpretation: <1.8 ft/sec, indicative of risk for recurrent falls General Gait Details: decreased left knee flexion during swing through and flat foot strike, educated pt and son on proper gait pattern to work towards  [employer]            Wheelchair Mobility    Modified Rankin (Stroke Patients Only)       Balance Overall balance assessment: Needs assistance Sitting-balance support: Feet supported;No upper extremity supported Sitting balance-Leahy Scale: Good     Standing balance support: Bilateral upper extremity supported Standing balance-Leahy Scale: Poor Standing balance comment: reliant on RW                             Pertinent Vitals/Pain Pain Assessment: 0-10 Pain Score: 4  Pain Location: left knee Pain Descriptors / Indicators: Grimacing Pain Intervention(s): Limited activity within patient's tolerance;Premedicated before session;Monitored during session;Repositioned    Home Living Family/patient expects to be discharged to:: Private residence Living Arrangements: Children Available Help at Discharge: Family;Available PRN/intermittently Type of Home: House Home Access: Level entry     Home Layout: One level Home Equipment: Cane - single point Additional Comments: pt lives with one son but son that is in room reports that he is taking her home to his house because he can be there more and  help her rehab better. He is hoping that when her rehab is finished, she can go to [place]    Prior Function Level of Independence: Independent with assistive device(s)         Comments: SPC     Hand Dominance        Extremity/Trunk  Assessment   Upper Extremity Assessment: Overall WFL for tasks assessed           Lower Extremity Assessment: LLE deficits/detail   LLE Deficits / Details: quad 2+/5, hip flex 2/5  Cervical / Trunk Assessment: Normal  Communication   Communication: Prefers language other than English (Spanish)  Cognition Arousal/Alertness: Awake/alert Behavior During Therapy: WFL for tasks assessed/performed Overall Cognitive Status: Within Functional Limits for tasks assessed (per her son)                      General Comments General comments (skin integrity, edema, etc.): educated pt on activity level upon return home, use of CPM and the importance of ambulation and ROM/ strengthening exercises. Son seemed to indicate that this may be challenging at home to get her to mobilize regularly    Exercises General Exercises - Lower Extremity Ankle Circles/Pumps: AROM;Both;10 reps;Seated Quad Sets: AROM;Both;10 reps;Supine Long Arc Quad: AROM;Left;10 reps;Seated Heel Slides: AROM;Left;10 reps;Supine Straight Leg Raises: AAROM;Left;10 reps;Supine      Assessment/Plan    PT Assessment Patient needs continued PT services  PT Diagnosis Difficulty walking;Abnormality of gait;Acute pain   PT Problem List Decreased strength;Decreased range of motion;Decreased activity tolerance;Decreased balance;Decreased mobility;Decreased knowledge of use of DME;Decreased knowledge of precautions;Pain  PT Treatment Interventions DME instruction;Gait training;Functional mobility training;Therapeutic activities;Therapeutic exercise;Balance training;Patient/family education   PT Goals (Current goals can be found in the Care Plan section) Acute Rehab PT Goals Patient Stated Goal: return home PT Goal Formulation: With patient/family Time For Goal Achievement: 02/23/16 Potential to Achieve Goals: Good    Frequency Min 5X/week   Barriers to discharge Decreased caregiver support family will be with her most of  the time but not all the time. Feel that there are some cultural barriers as well in that family seems uncomfortable making her get up    Co-evaluation               End of Session Equipment Utilized During Treatment: Gait belt Activity Tolerance: Patient tolerated treatment well Patient left: in chair;with call bell/phone within reach;with family/visitor present Nurse Communication: Mobility status    Functional Assessment Tool Used: clinical judgement Functional Limitation: Mobility: Walking and moving around Mobility: Walking and Moving Around Current Status (G9562): At least 1 percent but less than 20 percent impaired, limited or restricted Mobility: Walking and Moving Around Goal Status [phone]): 0 percent impaired, limited or restricted    Time: 0920-0955 PT Time Calculation (min) (ACUTE ONLY): 35 min   Charges:   PT Evaluation $PT Eval Low Complexity: 1 Procedure PT Treatments $Gait Training: 8-22 mins   PT G Codes:   PT G-Codes **NOT FOR INPATIENT CLASS** Functional Assessment Tool Used: clinical judgement Functional Limitation: Mobility: Walking and moving around Mobility: Walking and Moving Around Current Status (V7846): At least 1 percent but less than 20 percent impaired, limited or restricted Mobility: Walking and Moving Around Goal Status [phone]): 0 percent impaired, limited or restricted  Lyanne Co, PT  Acute Rehab Services  [phone]   Lyanne Co 02/16/2016, 11:00 AM

## 2016-02-16 NOTE — Progress Notes (Signed)
Orthopedic Tech Progress Note Patient Details:  Ariana Roberts 08/02/1935 [id number]  Patient ID: Ariana ShorterAna Peyton, female   DOB: 08/02/1935, 81 y.o.   MRN: [id number] Applied cpm 0-60  Trinna PostMartinez, Suleima Ohlendorf J 02/16/2016, 5:27 AM

## 2016-02-16 NOTE — Progress Notes (Signed)
Subjective: Pt stable   Objective: Vital signs in last 24 hours: Temp:  [97.8 F (36.6 C)-98.5 F (36.9 C)] 98 F (36.7 C) (06/14 0500) Pulse Rate:  [59-76] 59 (06/14 0500) Resp:  [11-19] 17 (06/14 0500) BP: (114-187)/(51-88) 127/57 mmHg (06/14 0500) SpO2:  [95 %-100 %] 95 % (06/14 0500) Weight:  [65.772 kg (145 lb)] 65.772 kg (145 lb) (06/13 1134)  Intake/Output from previous day: 06/13 0701 - 06/14 0700 In: 1290 [P.O.:40; I.V.:1250] Out: 25 [Blood:25] Intake/Output this shift:    Exam:  Sensation intact distally Intact pulses distally  Labs:  Recent Labs  02/15/16 1308  HGB 12.9    Recent Labs  02/15/16 1308  WBC 9.1  RBC 4.91  HCT 40.8  PLT 212    Recent Labs  02/15/16 1308  NA 139  K 4.8  CL 105  CO2 27  BUN 17  CREATININE 0.93  GLUCOSE 89  CALCIUM 9.8   No results for input(s): LABPT, INR in the last 72 hours.  Assessment/Plan: Plan dc today after pt   Bennett Vanscyoc SCOTT 02/16/2016, 7:36 AM

## 2016-02-18 NOTE — Discharge Summary (Signed)
Physician Discharge Summary  Patient ID: Ariana Roberts MRN: [id number] DOB/AGE: 1935/03/12 81 y.o.  Admit date: 02/15/2016 Discharge date: 02/16/2016  Admission Diagnoses:  Active Problems:   Acute medial meniscal tear   Discharge Diagnoses:  Same  Surgeries: Procedure(s): LEFT KNEE ARTHROSCOPY WITH MEDIAL MENISECTOMY on 02/15/2016   Consultants:    Discharged Condition: Stable  Hospital Course: Ariana Roberts is an 81 y.o. female who was admitted 02/15/2016 with a chief complaint of left knee pain, and found to have a diagnosis of left knee meniscal tear with minimal arthritis.  They were brought to the operating room on 02/15/2016 and underwent the above named procedures. She tolerated the arthroscopy well. Started on CPM machine as well as physical therapy for mobilization. Discharge home postop day #1 in good condition. Pain was controlled. Weightbearing as tolerated and encouraged with range of motion exercises and quad strengthening discussed by therapy   Antibiotics given:  Anti-infectives    Start     Dose/Rate Route Frequency Ordered Stop   02/15/16 1330  clindamycin (CLEOCIN) IVPB 900 mg     900 mg 100 mL/hr over 30 Minutes Intravenous To [facility] 02/14/16 1405 02/15/16 1627    .  Recent vital signs:  Filed Vitals:   02/16/16 0500 02/16/16 1000  BP: 127/57 122/52  Pulse: 59   Temp: 98 F (36.7 C)   Resp: 17     Recent laboratory studies:  Results for orders placed or performed during the hospital encounter of 02/15/16  Basic metabolic panel  Result Value Ref Range   Sodium 139 135 - 145 mmol/L   Potassium 4.8 3.5 - 5.1 mmol/L   Chloride 105 101 - 111 mmol/L   CO2 27 22 - 32 mmol/L   Glucose, Bld 89 65 - 99 mg/dL   BUN 17 6 - 20 mg/dL   Creatinine, Ser 4.090.93 0.44 - 1.00 mg/dL   Calcium 9.8 8.9 - 81.110.3 mg/dL   GFR calc non Af Amer 57 (L) >60 mL/min   GFR calc Af Amer >60 >60 mL/min   Anion gap 7 5 - 15  CBC  Result Value Ref Range   WBC 9.1 4.0 -  10.5 K/uL   RBC 4.91 3.87 - 5.11 MIL/uL   Hemoglobin 12.9 12.0 - 15.0 g/dL   HCT 91.440.8 78.236.0 - 95.646.0 %   MCV 83.1 78.0 - 100.0 fL   MCH 26.3 26.0 - 34.0 pg   MCHC 31.6 30.0 - 36.0 g/dL   RDW 21.313.4 08.611.5 - 57.815.5 %   Platelets 212 150 - 400 K/uL    Discharge Medications:     Medication List    TAKE these medications        aspirin 325 MG tablet  Take 1 tablet (325 mg total) by mouth daily.     diphenhydrAMINE 25 MG tablet  Commonly known as:  BENADRYL  Take 25 mg by mouth every 6 (six) hours as needed for allergies.     ezetimibe 10 MG tablet  Commonly known as:  ZETIA  Take 10 mg by mouth daily.     HYDROcodone-acetaminophen 5-325 MG tablet  Commonly known as:  NORCO/VICODIN  Take 1 tablet by mouth every 6 (six) hours as needed (breakthrough pain).     losartan-hydrochlorothiazide 100-25 MG tablet  Commonly known as:  HYZAAR  Take 1 tablet by mouth daily.     omeprazole 40 MG capsule  Commonly known as:  PRILOSEC  Take 40 mg by mouth daily.  Diagnostic Studies: No results found.  Disposition: 06-Home-Health Care Svc      Discharge Instructions    Call MD / Call 911    Complete by:  As directed   If you experience chest pain or shortness of breath, CALL 911 and be transported to the hospital emergency room.  If you develope a fever above 101 F, pus (white drainage) or increased drainage or redness at the wound, or calf pain, call your surgeon's office.     Constipation Prevention    Complete by:  As directed   Drink plenty of fluids.  Prune juice may be helpful.  You may use a stool softener, such as Colace (over the counter) 100 mg twice a day.  Use MiraLax (over the counter) for constipation as needed.     Diet - low sodium heart healthy    Complete by:  As directed      Discharge instructions    Complete by:  As directed   Weight bearing as tolerated with crutches or walker Bend knee as much as possible Ok to shower with waterproof dressings      Increase activity slowly as tolerated    Complete by:  As directed            Follow-up Information    Follow up with [facility].   Specialty:  Home Health Services   Why:  someone from [facility] willo contact you to arrange start date and time for therapy   Contact information:   [address] [place] [address] [phone]        Signed: Cammy Copa 02/18/2016, 4:43 PM

## 2017-02-06 ENCOUNTER — Ambulatory Visit: Payer: Medicare Other | Admitting: Podiatry

## 2017-02-08 ENCOUNTER — Ambulatory Visit (INDEPENDENT_AMBULATORY_CARE_PROVIDER_SITE_OTHER): Payer: Medicare Other

## 2017-02-08 ENCOUNTER — Ambulatory Visit (INDEPENDENT_AMBULATORY_CARE_PROVIDER_SITE_OTHER): Payer: Medicare Other | Admitting: Podiatry

## 2017-02-08 ENCOUNTER — Encounter: Payer: Self-pay | Admitting: Podiatry

## 2017-02-08 DIAGNOSIS — M722 Plantar fascial fibromatosis: Secondary | ICD-10-CM

## 2017-02-08 NOTE — Progress Notes (Signed)
She presents today with her son after having not seen her in some time. She is complaining of right heel pain once again. She states that the last time she was here 3 years ago gave her injections in her heels did very well until just the other week.  Objective: Vital signs are stable she is alert and oriented 3 no change in her past medical history medications or allergies no change in her neurovascular status. She has pain on palpation medial calcaneal tubercle of the right heel.  Assessment: Pain in limb secondary to plantar fasciitis right heel.  Plan: I injected the right heel today N plantar fascia Ariana Roberts will follow up with her in 1 month's discussed proper shoe gear stretching exercises and ice therapy.

## 2017-02-28 DIAGNOSIS — K219 Gastro-esophageal reflux disease without esophagitis: Secondary | ICD-10-CM | POA: Insufficient documentation

## 2017-02-28 DIAGNOSIS — J3089 Other allergic rhinitis: Secondary | ICD-10-CM | POA: Insufficient documentation

## 2017-03-16 ENCOUNTER — Other Ambulatory Visit (INDEPENDENT_AMBULATORY_CARE_PROVIDER_SITE_OTHER): Payer: Self-pay | Admitting: Orthopedic Surgery

## 2017-03-16 NOTE — Telephone Encounter (Signed)
Ok to rf? 

## 2017-03-22 ENCOUNTER — Ambulatory Visit (INDEPENDENT_AMBULATORY_CARE_PROVIDER_SITE_OTHER): Payer: Medicare Other | Admitting: Podiatry

## 2017-03-22 ENCOUNTER — Encounter: Payer: Self-pay | Admitting: Podiatry

## 2017-03-22 DIAGNOSIS — M722 Plantar fascial fibromatosis: Secondary | ICD-10-CM | POA: Diagnosis not present

## 2017-03-22 NOTE — Progress Notes (Signed)
She presents today for follow-up of pain to her right heel. She presents with her son who is an interpreter and states that her heel is doing much much better and she has no pain.  Objective: Vital signs are stable she is alert and oriented 3 pulses are palpable. No Pain. She is no reproducible pain on palpation of medial calcaneal tubercle of the right heel.  Assessment: Well-healing plantar fasciitis right.  Plan: Follow up with me on an as-needed basis or with regression.

## 2017-05-10 ENCOUNTER — Ambulatory Visit (INDEPENDENT_AMBULATORY_CARE_PROVIDER_SITE_OTHER): Payer: Medicare Other | Admitting: Podiatry

## 2017-05-10 ENCOUNTER — Ambulatory Visit: Payer: Medicare Other

## 2017-05-10 DIAGNOSIS — M722 Plantar fascial fibromatosis: Secondary | ICD-10-CM | POA: Diagnosis not present

## 2017-05-10 NOTE — Progress Notes (Signed)
She presents today for follow-up of her plantar fasciitis on her right heel. States that the left one is really not bothering her.  Objective: Vital signs are stable she is alert and oriented 3. Pulses are palpable. Neurologic sensorium is intact. Deep tendon reflexes are intact muscle strength is normal. His pain on palpation medial calcaneal tubercle of the right heel.  Assessment: Chronic intractable plantar fasciitis right heel.  Plan: I injected the right heel today with Kenalog and local anesthetic. Follow-up with her in the near future for surgical intervention possibly.

## 2017-06-06 ENCOUNTER — Ambulatory Visit (INDEPENDENT_AMBULATORY_CARE_PROVIDER_SITE_OTHER): Payer: Medicare Other

## 2017-06-06 ENCOUNTER — Ambulatory Visit (INDEPENDENT_AMBULATORY_CARE_PROVIDER_SITE_OTHER): Payer: Medicare Other | Admitting: Orthopedic Surgery

## 2017-06-06 ENCOUNTER — Encounter (INDEPENDENT_AMBULATORY_CARE_PROVIDER_SITE_OTHER): Payer: Self-pay | Admitting: Orthopedic Surgery

## 2017-06-06 DIAGNOSIS — M5441 Lumbago with sciatica, right side: Secondary | ICD-10-CM

## 2017-06-06 DIAGNOSIS — M25551 Pain in right hip: Secondary | ICD-10-CM | POA: Diagnosis not present

## 2017-06-06 MED ORDER — METHYLPREDNISOLONE 4 MG PO TABS: ORAL_TABLET | ORAL | 0 refills | Status: DC

## 2017-06-06 MED ORDER — BACLOFEN 10 MG PO TABS: 10.0000 mg | ORAL_TABLET | Freq: Two times a day (BID) | ORAL | 0 refills | Status: AC

## 2017-06-06 NOTE — Progress Notes (Signed)
Office Visit Note   Patient: Ariana Roberts           Date of Birth: [birth year]           MRN: [id number] Visit Date: 06/06/2017 Requested by: Marva Panda, NP [facility] [address] [place], [place] [address] PCP: Marva Panda, NP  Subjective: Chief Complaint  Patient presents with  . Lower Back - Pain  . Right Leg - Pain    HPI: Ariana Roberts is an 82 year old female with right hip leg and back pain.  It has been going on since 05/27/2017.  Pain is constant and worse with activity.  She feels better when she is sitting down.  She ambulates with a cane.  The pain does not wake her from sleep.  She denies any groin pain.  She denies any numbness and tingling.  She has tried Modic without relief.  Pain radiates from the buttock on the right-hand side down to the calf.              ROS: All systems reviewed are negative as they relate to the chief complaint within the history of present illness.  Patient denies  fevers or chills.   Assessment & Plan: Visit Diagnoses:  1. Acute midline low back pain with right-sided sciatica   2. Pain in right hip     Plan: Impression is right sided back pain with radiation down the leg.  Radiographs show facet arthritis.  Likely she has a little bit of nerve compression on the right-hand side.  I'm not try her on a Medrol Dosepak for 6 days plus muscle relaxer.  Come back in 4 weeks for clinical recheck and decision then for or against MRI scanning and injections  Follow-Up Instructions: Return in about 4 weeks (around 07/04/2017).   Orders:  Orders Placed This Encounter  Procedures  . XR Lumbar Spine 2-3 Views  . XR Pelvis 1-2 Views   Meds ordered this encounter  Medications  . methylPREDNISolone (MEDROL) 4 MG tablet    Sig: Take dose pak as directed    Dispense:  21 tablet    Refill:  0  . baclofen (LIORESAL) 10 MG tablet    Sig: Take 1 tablet (10 mg total) by mouth 2 (two) times daily.    Dispense:  30 each   Refill:  0      Procedures: No procedures performed   Clinical Data: No additional findings.  Objective: Vital Signs: There were no vitals taken for this visit.  Physical Exam:   Constitutional: Patient appears well-developed HEENT:  Head: Normocephalic Eyes:EOM are normal Neck: Normal range of motion Cardiovascular: Normal rate Pulmonary/chest: Effort normal Neurologic: Patient is alert Skin: Skin is warm Psychiatric: Patient has normal mood and affect    Ortho Exam: Orthopedic exam demonstrates slightly antalgic gait to the right but no nerve retention signs on the right or left side.  Ankle dorsiflexion plantar flexion quite hamstring strength is symmetric and intact.  There is no groin pain with internal/external rotation of the leg.  No other masses lymph adenopathy or skin changes noted in the leg region bilaterally.  Pedal pulses and symmetric reflexes are present.  No paresthesias L1-S1 bilaterally  Specialty Comments:  No specialty comments available.  Imaging: Xr Lumbar Spine 2-3 Views  Result Date: 06/06/2017 AP lateral lumbar spine reviewed.  Slight scoliosis is present.  Facet arthritis is noted distally in the lower lumbar segments.  Hip joints appear well maintained without arthritis.  Remainder bony pelvis normal.  Xr Pelvis 1-2 Views  Result Date: 06/06/2017 AP pelvis reviewed.  Minimal degenerative joint changes noted in the right hip.  Left hip appears normal.  No fracture or unusual bony lesions seen.    PMFS History: Patient Active Problem List   Diagnosis Date Noted  . Acute medial meniscal tear 02/15/2016  . Palpitations   . Hyperlipidemia   . GERD (gastroesophageal reflux disease)   . Hypertension   . Hypercholesteremia    Past Medical History:  Diagnosis Date  . GERD (gastroesophageal reflux disease)   . Hyperlipidemia   . Hypertension     Family History  Problem Relation Age of Onset  . Heart disease Unknown        No family  history    Past Surgical History:  Procedure Laterality Date  . COLONOSCOPY    . ESOPHAGOGASTRODUODENOSCOPY    . KNEE ARTHROSCOPY WITH MEDIAL MENISECTOMY Left 02/15/2016   Procedure: LEFT KNEE ARTHROSCOPY WITH MEDIAL MENISECTOMY;  Surgeon: Cammy Copa, MD;  Location: [facility] OR;  Service: Orthopedics;  Laterality: Left;  LEFT KNEE DIAGNOSTIC OPERATIVE ARHTROSCOPY, DEBRIDEMENT.  Marland Kitchen VAGINA SURGERY  2000   Social History   Occupational History  .  Retired   Social History Main Topics  . Smoking status: Never Smoker  . Smokeless tobacco: Never Used  . Alcohol use No  . Drug use: No  . Sexual activity: Not on file

## 2017-07-25 ENCOUNTER — Ambulatory Visit (INDEPENDENT_AMBULATORY_CARE_PROVIDER_SITE_OTHER): Payer: Medicare Other | Admitting: Orthopedic Surgery

## 2017-07-25 ENCOUNTER — Encounter (INDEPENDENT_AMBULATORY_CARE_PROVIDER_SITE_OTHER): Payer: Self-pay | Admitting: Orthopedic Surgery

## 2017-07-25 DIAGNOSIS — M5441 Lumbago with sciatica, right side: Secondary | ICD-10-CM | POA: Diagnosis not present

## 2017-07-25 NOTE — Progress Notes (Signed)
Office Visit Note   Patient: Ariana Roberts           Date of Birth: December 18, 1934           MRN: [id number] Visit Date: 07/25/2017 Requested by: Marva PandaMillsaps, Kimberly, NP [facility] [address] [place], [place] [id number] PCP: Marva PandaMillsaps, Kimberly, NP  Subjective: Chief Complaint  Patient presents with  . Lower Back - Follow-up    HPI: Ariana Bastosnna is an 82 year old with low back and right hip pain.  She has had 1 epidural steroid injection.  She also has known arthritis in the knees and she's doing reasonably well with that.  She's here with her son today.  She reports occasional back pain on the right-hand side with prolonged walking but in general she's doing reasonably well.  Not currently taking any medication for any of these problems in terms of anti-inflammatories.              ROS: All systems reviewed are negative as they relate to the chief complaint within the history of present illness.  Patient denies  fevers or chills.   Assessment & Plan: Visit Diagnoses:  1. Acute midline low back pain with right-sided sciatica     Plan: Impression is improvement in low back symptoms with 1 epidural started injection.  Knees are currently stable.  I would favor direct referral to Dr. Alvester MorinNewton for further issues with the back when they should recur.  That can be done in terms of epidural steroid injection with Dr. Alvester MorinNewton in the future.  For now she is doing recently well with all of her degenerative issues.  I'll see her back as needed  Follow-Up Instructions: Return if symptoms worsen or fail to improve.   Orders:  No orders of the defined types were placed in this encounter.  No orders of the defined types were placed in this encounter.     Procedures: No procedures performed   Clinical Data: No additional findings.  Objective: Vital Signs: There were no vitals taken for this visit.  Physical Exam:   Constitutional: Patient appears well-developed HEENT:  Head:  Normocephalic Eyes:EOM are normal Neck: Normal range of motion Cardiovascular: Normal rate Pulmonary/chest: Effort normal Neurologic: Patient is alert Skin: Skin is warm Psychiatric: Patient has normal mood and affect    Ortho Exam: Orthopedic exam demonstrates full active and passive range of motion of both ankles knees and hips.  No groin pain with internal rotation of the leg.  Patient has palpable pedal pulses.  No other masses lymph adenopathy or skin changes noted in the right hip or leg region.  Neither knee has an effusion.  She does have some right heel tenderness but this is being treated by a podiatrist  Specialty Comments:  No specialty comments available.  Imaging: No results found.   PMFS History: Patient Active Problem List   Diagnosis Date Noted  . Acute medial meniscal tear 02/15/2016  . Palpitations   . Hyperlipidemia   . GERD (gastroesophageal reflux disease)   . Hypertension   . Hypercholesteremia    Past Medical History:  Diagnosis Date  . GERD (gastroesophageal reflux disease)   . Hyperlipidemia   . Hypertension     Family History  Problem Relation Age of Onset  . Heart disease Unknown        No family history    Past Surgical History:  Procedure Laterality Date  . COLONOSCOPY    . ESOPHAGOGASTRODUODENOSCOPY    . KNEE  ARTHROSCOPY WITH MEDIAL MENISECTOMY Left 02/15/2016   Procedure: LEFT KNEE ARTHROSCOPY WITH MEDIAL MENISECTOMY;  Surgeon: Cammy CopaScott Pricilla Moehle, MD;  Location: [facility] OR;  Service: Orthopedics;  Laterality: Left;  LEFT KNEE DIAGNOSTIC OPERATIVE ARHTROSCOPY, DEBRIDEMENT.  Marland Kitchen. VAGINA SURGERY  2000   Social History   Occupational History    Employer: RETIRED  Tobacco Use  . Smoking status: Never Smoker  . Smokeless tobacco: Never Used  Substance and Sexual Activity  . Alcohol use: No  . Drug use: No  . Sexual activity: Not on file

## 2017-08-02 ENCOUNTER — Ambulatory Visit (INDEPENDENT_AMBULATORY_CARE_PROVIDER_SITE_OTHER): Payer: Medicare Other | Admitting: Podiatry

## 2017-08-02 ENCOUNTER — Encounter: Payer: Self-pay | Admitting: Podiatry

## 2017-08-02 DIAGNOSIS — M722 Plantar fascial fibromatosis: Secondary | ICD-10-CM

## 2017-08-02 NOTE — Progress Notes (Signed)
She presents today with her son states that her foot is not doing any better and she is ready to have something done to it. I have reviewed her past medical history medications and allergies. She denies any other allergies other than penicillin.  Objective: Vital signs are stable alert and oriented 3 pulses are strong and palpable. Severe pain on palpation medial tubercle of the right heel.  Assessment: Plantar fasciitis right heel.  Plan: We discussed etiology pathology conservative versus surgical therapies today. At this point she would like to consider surgical intervention consisting of an endoscopic plantar fasciotomy. With help of her son and interpretation we discussed the pros and cons of surgery as well as the possible postop complications which may include but are not limited to postop pain bleeding swelling infection recurrence and need for further surgery overcorrection under correction. We also dispensed a cam walker and discussed the surgery center and anesthesia. I will follow-up with her in the near future for surgical intervention.

## 2017-08-02 NOTE — Patient Instructions (Signed)
Pre-Operative Instructions  Congratulations, you have decided to take an important step towards improving your quality of life.  You can be assured that the doctors and staff at [facility] will be with you every step of the way.  Here are some important things you should know:  1. Plan to be at the surgery center/hospital at least 1 (one) hour prior to your scheduled time, unless otherwise directed by the surgical center/hospital staff.  You must have a responsible adult accompany you, remain during the surgery and drive you home.  Make sure you have directions to the surgical center/hospital to ensure you arrive on time. 2. If you are having surgery at [facility] or [facility] hospitals, you will need a copy of your medical history and physical form from your family physician within one month prior to the date of surgery. We will give you a form for your primary physician to complete.  3. We make every effort to accommodate the date you request for surgery.  However, there are times where surgery dates or times have to be moved.  We will contact you as soon as possible if a change in schedule is required.   4. No aspirin/ibuprofen for one week before surgery.  If you are on aspirin, any non-steroidal anti-inflammatory medications (Mobic, Aleve, Ibuprofen) should not be taken seven (7) days prior to your surgery.  You make take Tylenol for pain prior to surgery.  5. Medications - If you are taking daily heart and blood pressure medications, seizure, reflux, allergy, asthma, anxiety, pain or diabetes medications, make sure you notify the surgery center/hospital before the day of surgery so they can tell you which medications you should take or avoid the day of surgery. 6. No food or drink after midnight the night before surgery unless directed otherwise by surgical center/hospital staff. 7. No alcoholic beverages 24-hours prior to surgery.  No smoking 24-hours prior or 24-hours after  surgery. 8. Wear loose pants or shorts. They should be loose enough to fit over bandages, boots, and casts. 9. Don't wear slip-on shoes. Sneakers are preferred. 10. Bring your boot with you to the surgery center/hospital.  Also bring crutches or a walker if your physician has prescribed it for you.  If you do not have this equipment, it will be provided for you after surgery. 11. If you have not been contacted by the surgery center/hospital by the day before your surgery, call to confirm the date and time of your surgery. 12. Leave-time from work may vary depending on the type of surgery you have.  Appropriate arrangements should be made prior to surgery with your employer. 13. Prescriptions will be provided immediately following surgery by your doctor.  Fill these as soon as possible after surgery and take the medication as directed. Pain medications will not be refilled on weekends and must be approved by the doctor. 14. Remove nail polish on the operative foot and avoid getting pedicures prior to surgery. 15. Wash the night before surgery.  The night before surgery wash the foot and leg well with water and the antibacterial soap provided. Be sure to pay special attention to beneath the toenails and in between the toes.  Wash for at least three (3) minutes. Rinse thoroughly with water and dry well with a towel.  Perform this wash unless told not to do so by your physician.  Enclosed: 1 Ice pack (please put in freezer the night before surgery)   1 Hibiclens skin cleaner     Pre-op instructions  If you have any questions regarding the instructions, please do not hesitate to call our office.  [place]: [address], , [place] [address] -- [phone]  [place]: [address] [place], [place] [address] -- [phone]  [place]: [address]  [place], [place] [address] -- [phone]  [place]: [address], [address], [place], [place] [address] -- [phone]  Website:  [email or web address] 

## 2017-08-16 ENCOUNTER — Other Ambulatory Visit: Payer: Self-pay | Admitting: Podiatry

## 2017-08-16 MED ORDER — HYDROCODONE-ACETAMINOPHEN 10-325 MG PO TABS
1.0000 | ORAL_TABLET | Freq: Four times a day (QID) | ORAL | 0 refills | Status: DC | PRN
Start: 2017-08-16 — End: 2017-10-25

## 2017-08-16 MED ORDER — PROMETHAZINE HCL 25 MG PO TABS: 25.0000 mg | ORAL_TABLET | Freq: Three times a day (TID) | ORAL | 0 refills | Status: DC | PRN

## 2017-08-16 MED ORDER — CLINDAMYCIN HCL 150 MG PO CAPS: 150.0000 mg | ORAL_CAPSULE | Freq: Three times a day (TID) | ORAL | 0 refills | Status: DC

## 2017-08-17 ENCOUNTER — Encounter: Payer: Self-pay | Admitting: Podiatry

## 2017-08-17 DIAGNOSIS — M722 Plantar fascial fibromatosis: Secondary | ICD-10-CM | POA: Diagnosis not present

## 2017-08-20 ENCOUNTER — Telehealth: Payer: Self-pay | Admitting: *Deleted

## 2017-08-20 NOTE — Telephone Encounter (Signed)
POST OP CALL-  Called number listed in chart-called, never rang, tried twice. Need to get updated phone.

## 2017-08-23 ENCOUNTER — Ambulatory Visit (INDEPENDENT_AMBULATORY_CARE_PROVIDER_SITE_OTHER): Payer: Medicare Other | Admitting: Podiatry

## 2017-08-23 VITALS — BP 125/76 | Temp 99.1°F

## 2017-08-23 DIAGNOSIS — M722 Plantar fascial fibromatosis: Secondary | ICD-10-CM

## 2017-08-23 NOTE — Progress Notes (Signed)
She presents with her son today for interpretation for follow-up of her endoscopic plantar fasciotomy to her right foot.  She states that she has had absolutely no pain since the time of surgery.  She continues to wear her Cam walker.  Objective: Presents in a Cam walker walking on her forefoot.  Not bearing weight to the rear foot.  She has a dry sterile dressing intact was removed demonstrates no erythema edema cellulitis drainage or odor.  Sutures intact margins well coapted.  No signs of infection.  No pain on palpation of the heel.  Assessment: Well-healing endoscopic plantar fasciotomy right foot.  Plan: Discussed etiology pathology conservative versus surgical therapies.  At this point dressed her in a light dressing encouraged her to continue to wear the cam walker 24/7 I will follow-up with her in 1 week for suture removal only at that time she will get back into a regular pair of tennis shoes no barefoot feet tennis shoes or her boot.  And she is to sleep in her night splint for 1 more month.  She will be placed in a compression ankle at that time.  Follow-up with me 2 weeks after that.

## 2017-08-30 ENCOUNTER — Ambulatory Visit (INDEPENDENT_AMBULATORY_CARE_PROVIDER_SITE_OTHER): Payer: Medicare Other | Admitting: Podiatry

## 2017-08-30 ENCOUNTER — Encounter: Payer: Self-pay | Admitting: Podiatry

## 2017-08-30 DIAGNOSIS — M722 Plantar fascial fibromatosis: Secondary | ICD-10-CM

## 2017-08-30 NOTE — Progress Notes (Signed)
She presents today 2 weeks status post endoscopic plantar fasciotomy of the right foot.  She states that she is doing very well with no pain.  Objective: No erythema edema cellulitis drainage odor sutures are intact margins are well coapted.  Assessment: Well-healing endoscopic plantar fasciotomy of the right foot.  Sutures were removed today margins remain well coapted.  Plan: I instructed her to start back wearing her tennis shoe with no bare feet.  She will wear her night splint at bedtime and follow-up with me in 1 month

## 2017-09-27 ENCOUNTER — Encounter: Payer: Self-pay | Admitting: Podiatry

## 2017-09-27 ENCOUNTER — Ambulatory Visit (INDEPENDENT_AMBULATORY_CARE_PROVIDER_SITE_OTHER): Payer: Medicare Other | Admitting: Podiatry

## 2017-09-27 DIAGNOSIS — M722 Plantar fascial fibromatosis: Secondary | ICD-10-CM | POA: Diagnosis not present

## 2017-09-27 NOTE — Progress Notes (Signed)
She presents today with her son stating that she is there for follow-up visit regarding endoscopic plantar fasciotomy dated August 17, 2017 right foot.  She states that it still hurts some when she walks but otherwise she is doing very good.  Objective: Vital signs are stable alert and oriented x3.  Pulses are palpable.  She really does not have tenderness on palpation of the surgical site though she has not more distally.  Assessment: Plantar fasciitis status post endoscopic fasciotomy.  Most likely because she did not wear her boot at bedtime.  According to her son she did not wear her plantar fascial brace at bedtime.  Plan: At this point I dispensed plantar fascial brace instructed her to wear that I want to follow-up with her in 1 month we also discussed appropriate shoe gear stretching exercises once again.

## 2017-10-25 ENCOUNTER — Encounter: Payer: Self-pay | Admitting: Podiatry

## 2017-10-25 ENCOUNTER — Ambulatory Visit (INDEPENDENT_AMBULATORY_CARE_PROVIDER_SITE_OTHER): Payer: Medicare Other | Admitting: Podiatry

## 2017-10-25 DIAGNOSIS — M722 Plantar fascial fibromatosis: Secondary | ICD-10-CM

## 2017-10-25 MED ORDER — DICLOFENAC SODIUM 1 % TD GEL: 4.0000 g | Freq: Four times a day (QID) | TRANSDERMAL | 3 refills | Status: AC

## 2017-10-28 NOTE — Progress Notes (Signed)
She presents today for follow-up of her plantar fasciitis and endoscopic plantar fasciotomy.  She states that she is doing much better after the injection last visit.  She states that it is not well yet.  Objective: Vital signs are stable alert and oriented x3.  Pulses are palpable.  No tenderness on palpation of the foot.  Assessment: Resolving plantar fasciitis status post endoscopic fasciotomy with Rick with residual pain.  Plan: Wrote a prescription for diclofenac gel since she does not want any further injections.  I will follow-up with her.

## 2017-11-22 ENCOUNTER — Encounter: Payer: Medicare Other | Admitting: Podiatry

## 2018-04-11 ENCOUNTER — Other Ambulatory Visit (INDEPENDENT_AMBULATORY_CARE_PROVIDER_SITE_OTHER): Payer: Self-pay | Admitting: Orthopedic Surgery

## 2018-05-22 ENCOUNTER — Ambulatory Visit (INDEPENDENT_AMBULATORY_CARE_PROVIDER_SITE_OTHER): Payer: Medicare Other | Admitting: Orthopedic Surgery

## 2018-05-22 ENCOUNTER — Encounter (INDEPENDENT_AMBULATORY_CARE_PROVIDER_SITE_OTHER): Payer: Self-pay | Admitting: Orthopedic Surgery

## 2018-05-22 DIAGNOSIS — M5441 Lumbago with sciatica, right side: Secondary | ICD-10-CM | POA: Diagnosis not present

## 2018-05-22 NOTE — Progress Notes (Signed)
Office Visit Note   Patient: Ariana Roberts           Date of Birth: 11/13/1934           MRN: [id number] Visit Date: 05/22/2018 Requested by: Marva PandaMillsaps, Kimberly, NP [facility] [address] [place], [place] [address] PCP: Marva PandaMillsaps, Kimberly, NP  Subjective: Chief Complaint  Patient presents with  . Right Leg - Pain  . Lower Back - Pain    HPI: Patient presents for evaluation of right leg and low back pain.  Starts in the right buttock and radiates down the leg.  Is been going on for over a year but worse over the past 2 weeks.  She was seen in 2018 for similar problem.  She reports pain with bending.  The pain does not wake her from sleep.  She has had one prior epidural steroid injection which helped but she has not had MRI scan to localize the problem level.  She does report decreased walking endurance              ROS: All systems reviewed are negative as they relate to the chief complaint within the history of present illness.  Patient denies  fevers or chills.   Assessment & Plan: Visit Diagnoses:  1. Acute midline low back pain with right-sided sciatica     Plan: Impression is low back pain likely with facet arthritis and potentially spinal stenosis and possible  bulging disc.Marland Kitchen.  Plan is continue with anti-inflammatories.  Let us get an MRI scan to evaluate right-sided radiculopathy to better guide future epidural steroid injections either into the facet joints or in that space around the affected nerve root.  I will see her back in order to refer her to Dr. Alvester MorinNewton after the study.  Follow-Up Instructions: Return for after MRI.   Orders:  Orders Placed This Encounter  Procedures  . MR Lumbar Spine w/o contrast  . Ambulatory referral to Physical Medicine Rehab   No orders of the defined types were placed in this encounter.     Procedures: No procedures performed   Clinical Data: No additional findings.  Objective: Vital Signs: There were no  vitals taken for this visit.  Physical Exam:   Constitutional: Patient appears well-developed HEENT:  Head: Normocephalic Eyes:EOM are normal Neck: Normal range of motion Cardiovascular: Normal rate Pulmonary/chest: Effort normal Neurologic: Patient is alert Skin: Skin is warm Psychiatric: Patient has normal mood and affect    Ortho Exam: Ortho exam today demonstrates some pain with forward and lateral bending.  Pedal pulses palpable.  Negative clonus.  She does have nerve root tension signs on the right negative on the left.  More pain with extension and flexion.  No trochanteric tenderness is noted and there is no paresthesias L1-S1 bilaterally.  Specialty Comments:  No specialty comments available.  Imaging: No results found.   PMFS History: Patient Active Problem List   Diagnosis Date Noted  . Acute medial meniscal tear 02/15/2016  . Palpitations   . Hyperlipidemia   . GERD (gastroesophageal reflux disease)   . Hypertension   . Hypercholesteremia    Past Medical History:  Diagnosis Date  . GERD (gastroesophageal reflux disease)   . Hyperlipidemia   . Hypertension     Family History  Problem Relation Age of Onset  . Heart disease Unknown        No family history    Past Surgical History:  Procedure Laterality Date  . COLONOSCOPY    .  ESOPHAGOGASTRODUODENOSCOPY    . KNEE ARTHROSCOPY WITH MEDIAL MENISECTOMY Left 02/15/2016   Procedure: LEFT KNEE ARTHROSCOPY WITH MEDIAL MENISECTOMY;  Surgeon: Cammy Copa, MD;  Location: [facility] OR;  Service: Orthopedics;  Laterality: Left;  LEFT KNEE DIAGNOSTIC OPERATIVE ARHTROSCOPY, DEBRIDEMENT.  Marland Kitchen VAGINA SURGERY  2000   Social History   Occupational History    Employer: RETIRED  Tobacco Use  . Smoking status: Never Smoker  . Smokeless tobacco: Never Used  Substance and Sexual Activity  . Alcohol use: No  . Drug use: No  . Sexual activity: Not on file

## 2018-05-24 ENCOUNTER — Ambulatory Visit (INDEPENDENT_AMBULATORY_CARE_PROVIDER_SITE_OTHER): Payer: Medicare Other | Admitting: Orthopedic Surgery

## 2018-06-02 ENCOUNTER — Ambulatory Visit
Admission: RE | Admit: 2018-06-02 | Discharge: 2018-06-02 | Disposition: A | Discharge status: Home / Self Care | Payer: Medicare Other | Source: Ambulatory Visit | Attending: Orthopedic Surgery | Admitting: Orthopedic Surgery

## 2018-06-02 DIAGNOSIS — M5441 Lumbago with sciatica, right side: Secondary | ICD-10-CM

## 2018-06-05 ENCOUNTER — Telehealth (INDEPENDENT_AMBULATORY_CARE_PROVIDER_SITE_OTHER): Payer: Self-pay | Admitting: *Deleted

## 2018-06-05 NOTE — Telephone Encounter (Signed)
Please advise. We had ordered MRI with appt for patient to see Dr Alvester Morin to follow. However, patients son feels like that they should see you to discuss results before seeing Dr Alvester Morin. Do you want to just call them with results and then they can see Dr Alvester Morin?  Her son states she does not speak [place] and you would have to talk with him so that he can translate.

## 2018-06-06 NOTE — Telephone Encounter (Signed)
Can you please review scan and advise?

## 2018-06-06 NOTE — Telephone Encounter (Signed)
IC and advised but he was asking if you would please call to further discuss. He said that his mom is supposed to be leaving to go out of town and wanted to know if injection could be done before 10/10. I advised Dr August Saucer would call to further discuss scan and would check with Dr [place] Blas assistant to see how soon they could get her in.

## 2018-06-06 NOTE — Telephone Encounter (Signed)
Ok to call him with results

## 2018-06-06 NOTE — Telephone Encounter (Signed)
Severe right-sided neuroforaminal narrowing at L4-5.  The nerve is pinched and compressed at this level.  Not too much else going on in the lumbar spine MRI.  This would explain her symptoms.  I think she would do well with an attempt at an injection into that region with Dr. Alvester Morin.  If that does not work then we will refer her to 1 of our surgeons.  Please call thanks and if he wants to talk to me I will call him tomorrow or Monday

## 2018-06-07 NOTE — Telephone Encounter (Signed)
Unfortunately we are overbooked and do not have any appt slots for an injection. Per Dr.Newton [facility] Imagining may get her in quicker. Thanks.

## 2018-06-10 ENCOUNTER — Ambulatory Visit (INDEPENDENT_AMBULATORY_CARE_PROVIDER_SITE_OTHER): Payer: Medicare Other | Admitting: Orthopedic Surgery

## 2018-06-10 NOTE — Telephone Encounter (Signed)
Patient no showed today's appointment. Can you please call her son to discus MRI scan? They were wanting injection with Dr Alvester Morin before she leaves to go out of town in about a week but they are not able to get her seen that soon.

## 2018-06-10 NOTE — Telephone Encounter (Signed)
Patient coming in to be seen today. Can discuss at that time.

## 2018-06-10 NOTE — Telephone Encounter (Signed)
I tried calling. No answer.  

## 2018-06-12 ENCOUNTER — Ambulatory Visit (INDEPENDENT_AMBULATORY_CARE_PROVIDER_SITE_OTHER): Payer: Medicare Other | Admitting: Orthopedic Surgery

## 2018-07-25 DIAGNOSIS — L249 Irritant contact dermatitis, unspecified cause: Secondary | ICD-10-CM | POA: Insufficient documentation

## 2018-07-25 DIAGNOSIS — L821 Other seborrheic keratosis: Secondary | ICD-10-CM | POA: Insufficient documentation

## 2019-05-26 IMAGING — MR MR LUMBAR SPINE W/O CM
5 series · 31 of 48 positions shown · non-contrast
Comparison: Lumbar spine radiographs June 06, 2017

CLINICAL DATA: Chronic low back pain, worsening for 3 months.

EXAM:
MRI LUMBAR SPINE WITHOUT CONTRAST
TECHNIQUE: Multiplanar, multisequence MR imaging of the lumbar spine was
performed. No intravenous contrast was administered.

[Series 3: T2 · sagittal · 4.0mm · 0.45mm/px · 6 of 12 slices shown (1 of 2)]
[im 1/12]
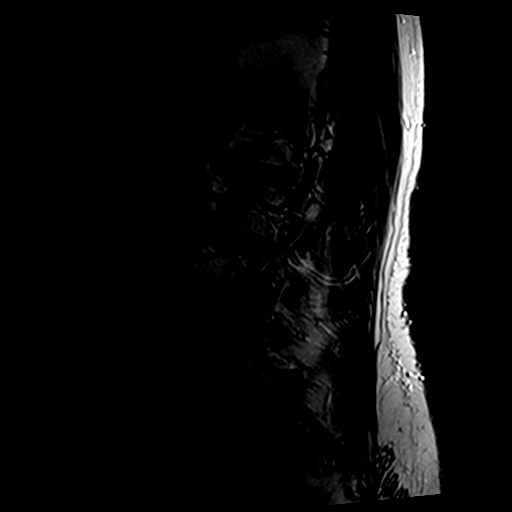
[im 3/12]
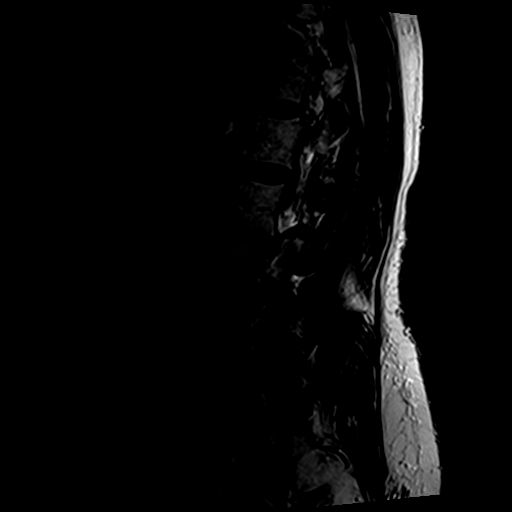
[im 5/12]
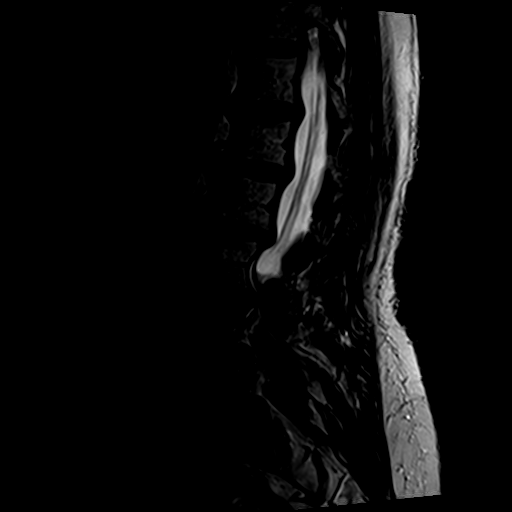
[im 7/12]
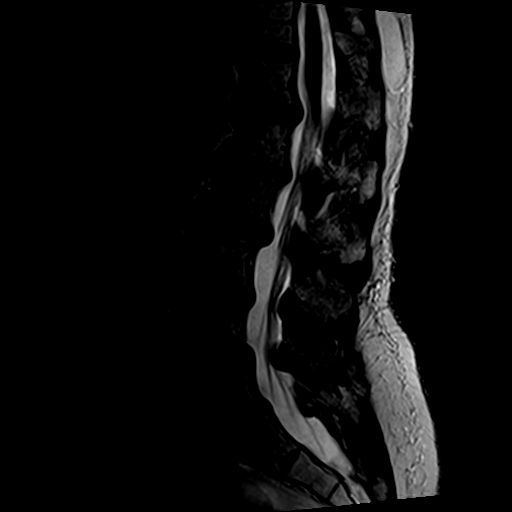
[im 9/12]
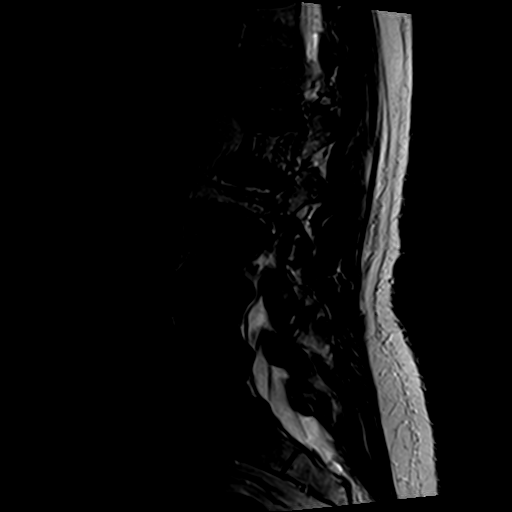
[im 12/12]
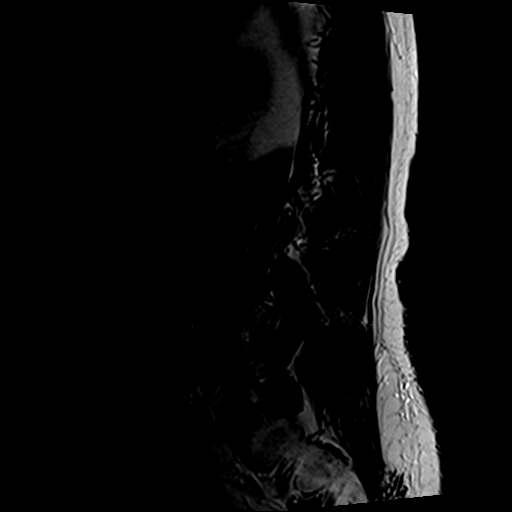

[Series 4: T1 · sagittal · 4.0mm · 0.45mm/px · 6 of 12 slices shown (1 of 2)]
[im 1/12]
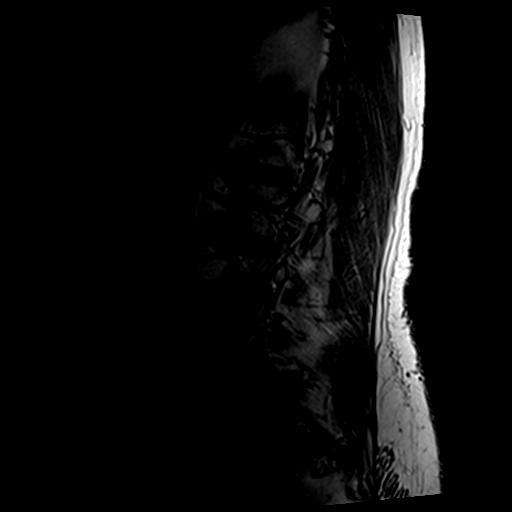
[im 3/12]
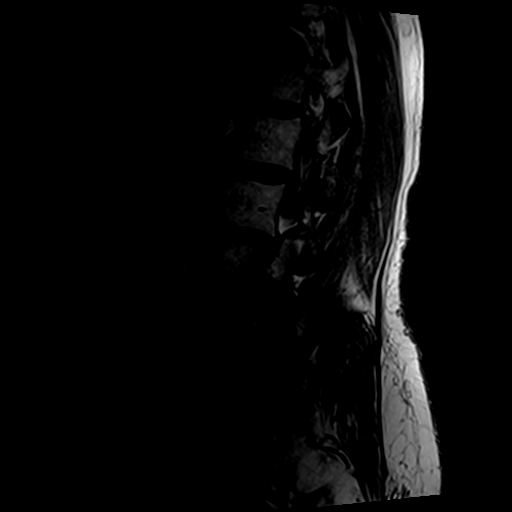
[im 5/12]
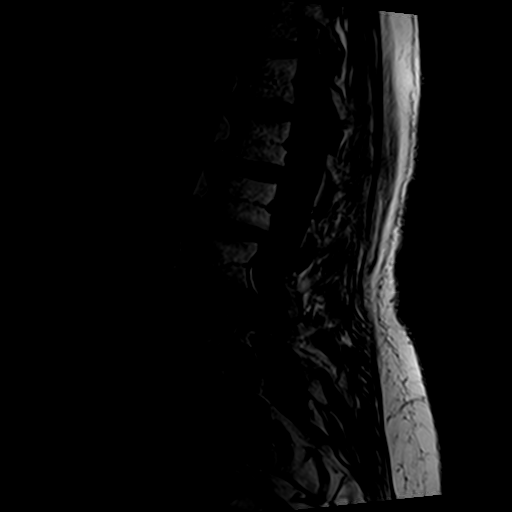
[im 7/12]
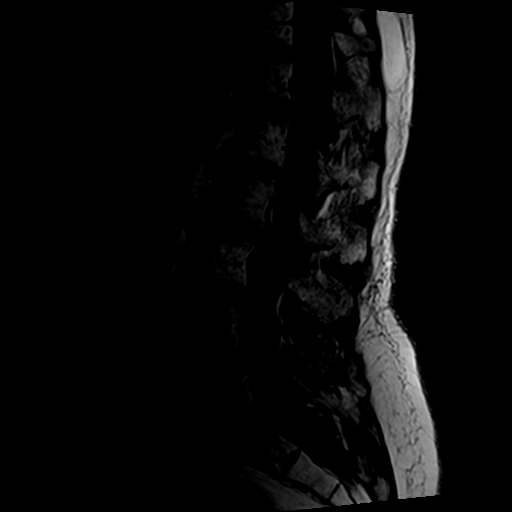
[im 9/12]
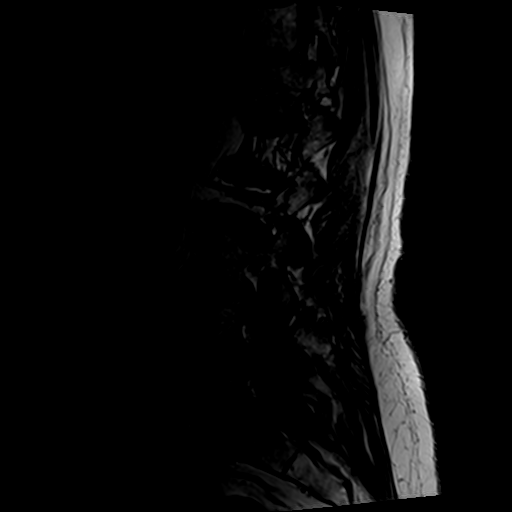
[im 12/12]
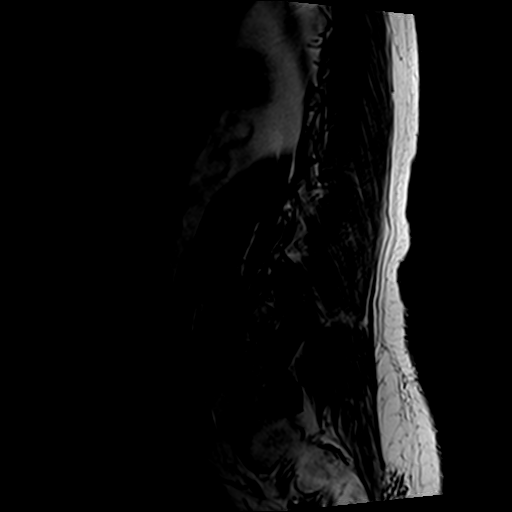

[Series 5: STIR · sagittal · 4.0mm · 0.45mm/px · 1 of 12 slices shown]
[im 1/12]
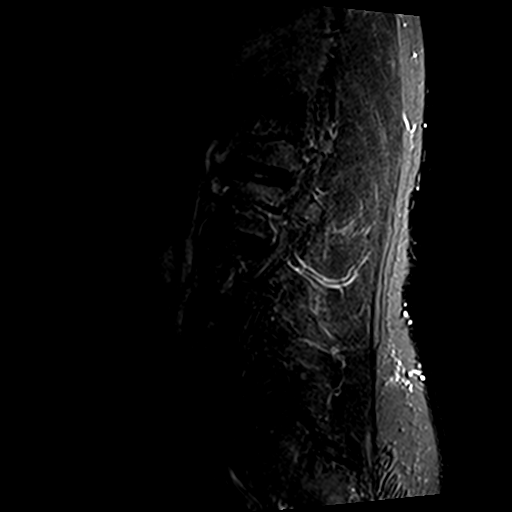

[Series 9: T2 · axial · 4.0mm · 0.70mm/px · z∈[-78,+105]mm · 9 of 32 slices shown (2 of 2)]
[im 1/32]
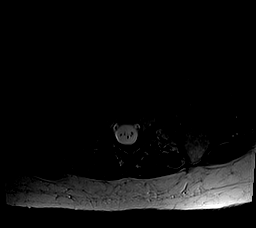
[im 5/32]
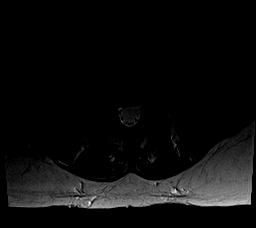
[im 9/32]
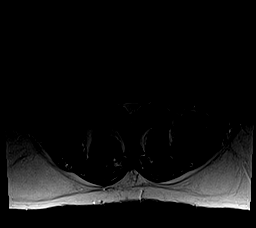
[im 14/32]
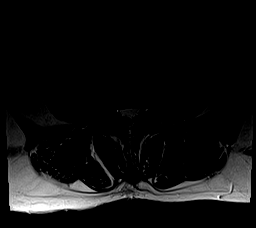
[im 16/32]
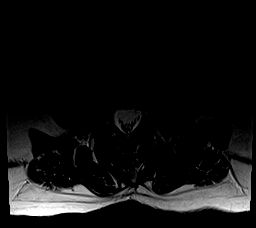
[im 18/32]
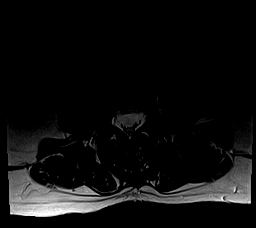
[im 23/32]
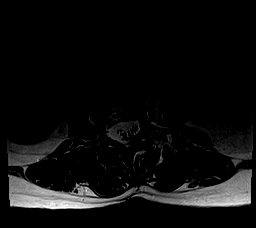
[im 27/32]
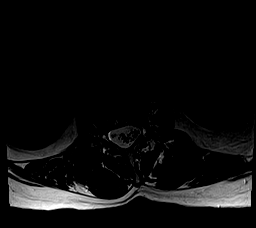
[im 32/32]
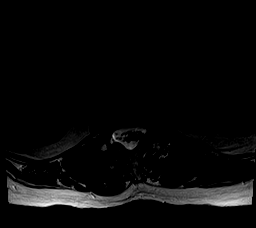

[Series 11: T1 · axial · 4.0mm · 0.70mm/px · z∈[-78,+107]mm · 9 of 32 slices shown (2 of 2)]
[im 1/32]
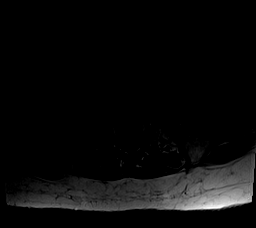
[im 5/32]
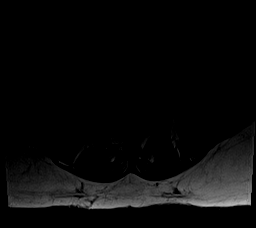
[im 9/32]
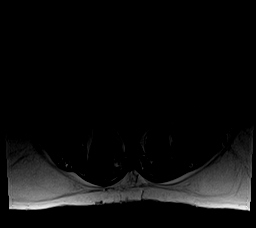
[im 14/32]
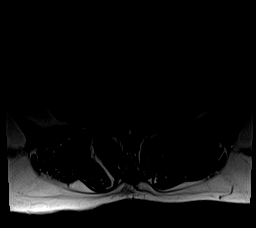
[im 16/32]
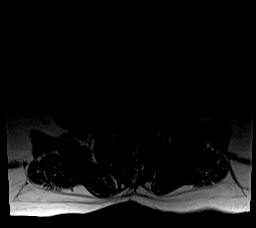
[im 18/32]
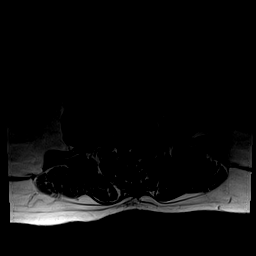
[im 23/32]
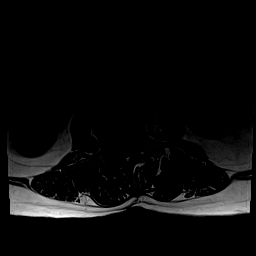
[im 27/32]
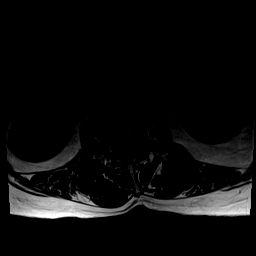
[im 32/32]
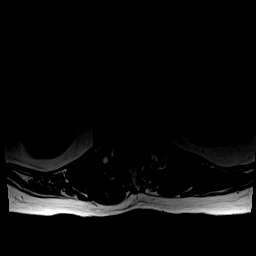

[31 of 48 positions shown; findings below may reference images not displayed]

FINDINGS: SEGMENTATION: For the purposes of this report, the last well-formed
intervertebral disc is reported as L5-S1.

ALIGNMENT: Maintained lumbar lordosis. Grade 1 L4-5 anterolisthesis
without spondylolysis. Minimal grade 1 L2-3 retrolisthesis.

VERTEBRAE:Vertebral bodies are intact. Intervertebral discs
demonstrate normal morphology and signal characteristics. No
abnormal bone marrow signal.

CONUS MEDULLARIS AND CAUDA EQUINA: Conus medullaris terminates at
L1-2 and demonstrates normal morphology and signal characteristics.
8 mm low signal intracanalicular nodule along the cauda equina at
the level of S2.

PARASPINAL AND OTHER SOFT TISSUES: Nonacute.

DISC LEVELS:

T12-L1: Small broad-based disc bulge asymmetric to LEFT. No canal
stenosis. Moderate LEFT neural foraminal narrowing.

L1-2: Small broad-based disc bulge asymmetric to the LEFT. No canal
stenosis. Mild LEFT neural foraminal narrowing.

L2-3: Retrolisthesis. Moderate broad-based disc bulge. No canal
stenosis. Mild bilateral neural foraminal narrowing.

L3-4: Small broad-based disc bulge. Minimal facet arthropathy and
ligamentum flavum redundancy. Mild canal stenosis. Moderate RIGHT
and mild LEFT neural foraminal narrowing.

L4-5: Anterolisthesis. Small broad-based disc bulge with unroofing
of the disc. Moderate to severe RIGHT and mild LEFT facet
arthropathy and ligamentum flavum redundancy. Mild canal stenosis.
Moderate to severe RIGHT and mild LEFT neural foraminal narrowing.

L5-S1: Small broad-based disc bulge. Minimal facet arthropathy
without canal stenosis. Moderate RIGHT neural foraminal narrowing.
IMPRESSION: 1. No fracture. Grade 1 L4-5 anterolisthesis without spondylolysis.
Minimal grade 1 L2-3 retrolisthesis.
2. Mild canal stenosis L3-4 and L4-5.
3. Neural foraminal narrowing at all lumbar levels: Moderate to
severe on the RIGHT at L4-5.
4. 8 mm nodule along cauda equina at the level sacrum, probable
schwannoma which could be confirmed with contrast-enhanced MRI.

## 2019-06-23 ENCOUNTER — Encounter: Payer: Self-pay | Admitting: Orthopedic Surgery

## 2019-06-23 ENCOUNTER — Ambulatory Visit (INDEPENDENT_AMBULATORY_CARE_PROVIDER_SITE_OTHER): Payer: Medicare Other | Admitting: Orthopedic Surgery

## 2019-06-23 ENCOUNTER — Other Ambulatory Visit: Payer: Self-pay

## 2019-06-23 DIAGNOSIS — M47816 Spondylosis without myelopathy or radiculopathy, lumbar region: Secondary | ICD-10-CM

## 2019-06-23 DIAGNOSIS — M7062 Trochanteric bursitis, left hip: Secondary | ICD-10-CM

## 2019-06-23 DIAGNOSIS — M25552 Pain in left hip: Secondary | ICD-10-CM

## 2019-06-23 DIAGNOSIS — M48061 Spinal stenosis, lumbar region without neurogenic claudication: Secondary | ICD-10-CM

## 2019-06-23 MED ORDER — LIDOCAINE HCL 1 % IJ SOLN
5.0000 mL | INTRAMUSCULAR | Status: AC | PRN
  Administered 2019-06-23: 5 mL

## 2019-06-23 MED ORDER — METHYLPREDNISOLONE ACETATE 80 MG/ML IJ SUSP
80.0000 mg | INTRAMUSCULAR | Status: AC | PRN
  Administered 2019-06-23: 80 mg via INTRA_ARTICULAR

## 2019-06-23 MED ORDER — BUPIVACAINE HCL 0.25 % IJ SOLN
4.0000 mL | INTRAMUSCULAR | Status: AC | PRN
  Administered 2019-06-23: 4 mL via INTRA_ARTICULAR

## 2019-06-23 NOTE — Progress Notes (Signed)
Office Visit Note   Patient: Ariana Roberts           Date of Birth: 1935-01-25           MRN: [id number] Visit Date: 06/23/2019 Requested by: Everardo Beals, NP [place],  [place] [address] PCP: Everardo Beals, NP  Subjective: Chief Complaint  Patient presents with  . Lower Back - Pain    HPI: Ariana Roberts is a 84 y.o. female who presents to the office complaining of back pain and lateral left hip pain.  Patient was last seen by this office on 05/22/2018.  She was referred for MRI of the L-spine due to low back pain with right-sided radicular leg symptoms.  MRI was obtained on 06/02/2018 that revealed foraminal narrowing at all lumbar levels, most severe on the right at L4-5, mild canal stenosis at L3-L4 and L4-L5, grade 1 anterolisthesis at L4-5, grade 1 retrolisthesis at L2-L3, and an 8 mm nodule along the cauda equina at the level of the sacrum suspicious for schwannoma.  Patient was referred to Dr. Ernestina Patches for ESI's but she never followed up as she took a trip to [place] and then was unable to get in contact with his office upon her return weeks later.  Today, patient denies any radicular leg symptoms or numbness/tingling but notes her main complaint is axial lumbar back pain, worse on the left side.  She is able to ambulate but it causes her pain.  She denies any weakness.  Her other complaint is lateral left hip pain that she states bothers her most in the morning when getting up from bed.  It hurts to lay on her left side.  She has left lateral hip pain with pushing up into a standing position from a sitting position.  Once she is able to walk a couple steps the pain disappears.  She has tried Mobic for the pain without much relief.                ROS:  All systems reviewed are negative as they relate to the chief complaint within the history of present illness.  Patient denies fevers or chills.  Assessment & Plan: Visit Diagnoses:  1. Facet arthropathy, lumbar    2. Greater trochanteric pain syndrome of left lower extremity   3. Spinal stenosis of lumbar region without neurogenic claudication     Plan: Patient is an 84 year old female who presents with complaint of lumbar axial back pain and left lateral hip pain.  She had MRI in September 2019 with the findings as stated above in HPI.  Impression is facet arthropathy with foraminal spinal stenosis  Will refer to Dr. Romona Curls office for ESI's.  As for her lateral left hip pain, impression is greater trochanteric pain syndrome.  She has pain with laying on the left side and with activating her gluteal muscles when standing from a sitting position.  She is tender over the greater trochanter.  Recommended cortisone injection into the left greater trochanteric bursa.  No groin pain or intrinsic hip symptoms are present.  Patient consented and tolerated the procedure well.  Patient will follow-up with the office as needed.  Follow-Up Instructions: No follow-ups on file.   Orders:  No orders of the defined types were placed in this encounter.  No orders of the defined types were placed in this encounter.     Procedures: Large Joint Inj: L greater trochanter on 06/23/2019 10:16 PM Indications: pain and diagnostic evaluation Details:  18 G 3.5 in needle, ultrasound-guided lateral approach  Arthrogram: No  Medications: 5 mL lidocaine 1 %; 80 mg methylPREDNISolone acetate 80 MG/ML; 4 mL bupivacaine 0.25 % Outcome: tolerated well, no immediate complications Procedure, treatment alternatives, risks and benefits explained, specific risks discussed. Consent was given by the patient. Immediately prior to procedure a time out was called to verify the correct patient, procedure, equipment, support staff and site/side marked as required. Patient was prepped and draped in the usual sterile fashion.       Clinical Data: No additional findings.  Objective: Vital Signs: There were no vitals taken for this  visit.  Physical Exam:  Constitutional: Patient appears well-developed HEENT:  Head: Normocephalic Eyes:EOM are normal Neck: Normal range of motion Cardiovascular: Normal rate Pulmonary/chest: Effort normal Neurologic: Patient is alert Skin: Skin is warm Psychiatric: Patient has normal mood and affect  Ortho Exam:  Tenderness to palpation throughout the distal axial lumbar spine as well as the left sided paraspinal musculature.  No pain throughout the right paraspinal musculature.  Pain is no worse with extension or flexion. 5/5 motor strength of the hip flexors, quadriceps, dorsiflexion, plantarflexion. Moderate tenderness to palpation over the left trochanteric bursa Mild tenderness to palpation over the right trochanteric bursa No pain with internal rotation of bilateral hip joints Negative straight leg raise bilaterally  Specialty Comments:  No specialty comments available.  Imaging: No results found.   PMFS History: Patient Active Problem List   Diagnosis Date Noted  . Acute medial meniscal tear 02/15/2016  . Palpitations   . Hyperlipidemia   . GERD (gastroesophageal reflux disease)   . Hypertension   . Hypercholesteremia    Past Medical History:  Diagnosis Date  . GERD (gastroesophageal reflux disease)   . Hyperlipidemia   . Hypertension     Family History  Problem Relation Age of Onset  . Heart disease Unknown        No family history    Past Surgical History:  Procedure Laterality Date  . COLONOSCOPY    . ESOPHAGOGASTRODUODENOSCOPY    . KNEE ARTHROSCOPY WITH MEDIAL MENISECTOMY Left 02/15/2016   Procedure: LEFT KNEE ARTHROSCOPY WITH MEDIAL MENISECTOMY;  Surgeon: Cammy Copa, MD;  Location: [facility] OR;  Service: Orthopedics;  Laterality: Left;  LEFT KNEE DIAGNOSTIC OPERATIVE ARHTROSCOPY, DEBRIDEMENT.  Marland Kitchen VAGINA SURGERY  2000   Social History   Occupational History    Employer: RETIRED  Tobacco Use  . Smoking status: Never Smoker  . Smokeless  tobacco: Never Used  Substance and Sexual Activity  . Alcohol use: No  . Drug use: No  . Sexual activity: Not on file

## 2019-07-09 ENCOUNTER — Telehealth: Payer: Self-pay | Admitting: Orthopedic Surgery

## 2019-07-09 ENCOUNTER — Other Ambulatory Visit: Payer: Self-pay

## 2019-07-09 DIAGNOSIS — M47816 Spondylosis without myelopathy or radiculopathy, lumbar region: Secondary | ICD-10-CM

## 2019-07-09 NOTE — Telephone Encounter (Signed)
Spoke with pt son and advised him that we are booked out until 07/28/2019, pt states that was fine and pt was added to the waitlist. Pt is scheduled on 07/28/2019 at 0930.

## 2019-07-09 NOTE — Telephone Encounter (Signed)
Sent referral to Dr. Newton to advise.  °

## 2019-07-09 NOTE — Telephone Encounter (Signed)
Tried calling son to advise. No answer, no VM to LM.

## 2019-07-09 NOTE — Telephone Encounter (Signed)
Patient's son Meda Coffee walked in office requesting the status of the referral to  Dr Ernestina Patches.  He states Dr Marlou Sa was to place referral for back injection.

## 2019-07-09 NOTE — Telephone Encounter (Signed)
I am unsure if this was missed or not communicated but either way order not put in until today. Can you please call and schedule them ASAP? Thanks.

## 2019-07-28 ENCOUNTER — Ambulatory Visit (INDEPENDENT_AMBULATORY_CARE_PROVIDER_SITE_OTHER): Payer: Medicare Other | Admitting: Physical Medicine and Rehabilitation

## 2019-07-28 ENCOUNTER — Ambulatory Visit: Payer: Self-pay

## 2019-07-28 ENCOUNTER — Other Ambulatory Visit: Payer: Self-pay

## 2019-07-28 ENCOUNTER — Encounter: Payer: Self-pay | Admitting: Physical Medicine and Rehabilitation

## 2019-07-28 VITALS — BP 145/67 | HR 89

## 2019-07-28 DIAGNOSIS — M47816 Spondylosis without myelopathy or radiculopathy, lumbar region: Secondary | ICD-10-CM | POA: Diagnosis not present

## 2019-07-28 DIAGNOSIS — M5116 Intervertebral disc disorders with radiculopathy, lumbar region: Secondary | ICD-10-CM

## 2019-07-28 MED ORDER — BETAMETHASONE SOD PHOS & ACET 6 (3-3) MG/ML IJ SUSP
12.0000 mg | Freq: Once | INTRAMUSCULAR | Status: AC
  Administered 2019-07-28: 09:00:00 12 mg

## 2019-07-28 MED ORDER — METHYLPREDNISOLONE ACETATE 80 MG/ML IJ SUSP: 80.0000 mg | Freq: Once | INTRAMUSCULAR | Status: DC

## 2019-07-28 NOTE — Progress Notes (Signed)
Ariana Roberts - 84 y.o. female MRN [id number]  Date of birth: 02/06/1935  Office Visit Note: Visit Date: 07/28/2019 PCP: Everardo Beals, NP Referred by: Everardo Beals, NP  Subjective: Chief Complaint  Patient presents with  . Lower Back - Pain   HPI: Ariana Roberts is a 84 y.o. female who comes in today At the request of Dr. Anderson Malta, M.D. for interventional spine procedure.  Patient is having left-sided low back pain radiating to the upper hip worse in the morning and when she first gets going then better when she is moving after that.  No real claudication symptoms.  MRI of the lumbar spine has been performed and she does have small listhesis of L4 on L5 with some facet arthropathy but it appears to me that there is a left extraforaminal disc protrusion compared to right.  She has right more than left arthropathy.  She has no central canal stenosis.  Prior right-sided greater trochanteric injection by Dr. Marlou Sa was successful for her right side.  We are to complete a left L4 transforaminal epidural steroid injection today.  Consider facet joint block if not beneficial.  Her son is present today and does act as interpreter.  ROS Otherwise per HPI.  Assessment & Plan: Visit Diagnoses:  1. Spondylosis without myelopathy or radiculopathy, lumbar region   2. Radiculopathy due to lumbar intervertebral disc disorder     Plan: No additional findings.   Meds & Orders:  Meds ordered this encounter  Medications  . DISCONTD: methylPREDNISolone acetate (DEPO-MEDROL) injection 80 mg  . betamethasone acetate-betamethasone sodium phosphate (CELESTONE) injection 12 mg    Orders Placed This Encounter  Procedures  . XR C-ARM NO REPORT  . Epidural Steroid injection    Follow-up: Return if symptoms worsen or fail to improve.   Procedures: No procedures performed  Lumbosacral Transforaminal Epidural Steroid Injection - Sub-Pedicular Approach with Fluoroscopic Guidance  Patient: Ariana Roberts       Date of Birth: Apr 30, 1935 MRN: [id number] PCP: Everardo Beals, NP      Visit Date: 07/28/2019   Universal Protocol:    Date/Time: 07/28/2019  Consent Given By: the patient  Position: PRONE  Additional Comments: Vital signs were monitored before and after the procedure. Patient was prepped and draped in the usual sterile fashion. The correct patient, procedure, and site was verified.   Injection Procedure Details:  Procedure Site One Meds Administered:  Meds ordered this encounter  Medications  . DISCONTD: methylPREDNISolone acetate (DEPO-MEDROL) injection 80 mg  . betamethasone acetate-betamethasone sodium phosphate (CELESTONE) injection 12 mg    Laterality: Left  Location/Site:  L4-L5  Needle size: 22 G  Needle type: Spinal  Needle Placement: Transforaminal  Findings:    -Comments: Excellent flow of contrast along the nerve and into the epidural space.  Procedure Details: After squaring off the end-plates to get a true AP view, the C-arm was positioned so that an oblique view of the foramen as noted above was visualized. The target area is just inferior to the "nose of the scotty dog" or sub pedicular. The soft tissues overlying this structure were infiltrated with 2-3 ml. of 1% Lidocaine without Epinephrine.  The spinal needle was inserted toward the target using a "trajectory" view along the fluoroscope beam.  Under AP and lateral visualization, the needle was advanced so it did not puncture dura and was located close the 6 O'Clock position of the pedical in AP tracterory. Biplanar projections were used to confirm position. Aspiration was confirmed  to be negative for CSF and/or blood. A 1-2 ml. volume of Isovue-250 was injected and flow of contrast was noted at each level. Radiographs were obtained for documentation purposes.   After attaining the desired flow of contrast documented above, a 0.5 to 1.0 ml test dose of 0.25% Marcaine was injected into each  respective transforaminal space.  The patient was observed for 90 seconds post injection.  After no sensory deficits were reported, and normal lower extremity motor function was noted,   the above injectate was administered so that equal amounts of the injectate were placed at each foramen (level) into the transforaminal epidural space.   Additional Comments:  The patient tolerated the procedure well Dressing: 2 x 2 sterile gauze and Band-Aid    Post-procedure details: Patient was observed during the procedure. Post-procedure instructions were reviewed.  Patient left the clinic in stable condition.     Clinical History: MRI LUMBAR SPINE WITHOUT CONTRAST  TECHNIQUE: Multiplanar, multisequence MR imaging of the lumbar spine was performed. No intravenous contrast was administered.  COMPARISON:  Lumbar spine radiographs June 06, 2017  FINDINGS: SEGMENTATION: For the purposes of this report, the last well-formed intervertebral disc is reported as L5-S1.  ALIGNMENT: Maintained lumbar lordosis. Grade 1 L4-5 anterolisthesis without spondylolysis. Minimal grade 1 L2-3 retrolisthesis.  VERTEBRAE:Vertebral bodies are intact. Intervertebral discs demonstrate normal morphology and signal characteristics. No abnormal bone marrow signal.  CONUS MEDULLARIS AND CAUDA EQUINA: Conus medullaris terminates at L1-2 and demonstrates normal morphology and signal characteristics. 8 mm low signal intracanalicular nodule along the cauda equina at the level of S2.  PARASPINAL AND OTHER SOFT TISSUES: Nonacute.  DISC LEVELS:  T12-L1: Small broad-based disc bulge asymmetric to LEFT. No canal stenosis. Moderate LEFT neural foraminal narrowing.  L1-2: Small broad-based disc bulge asymmetric to the LEFT. No canal stenosis. Mild LEFT neural foraminal narrowing.  L2-3: Retrolisthesis. Moderate broad-based disc bulge. No canal stenosis. Mild bilateral neural foraminal narrowing.  L3-4:  Small broad-based disc bulge. Minimal facet arthropathy and ligamentum flavum redundancy. Mild canal stenosis. Moderate RIGHT and mild LEFT neural foraminal narrowing.  L4-5: Anterolisthesis. Small broad-based disc bulge with unroofing of the disc. Moderate to severe RIGHT and mild LEFT facet arthropathy and ligamentum flavum redundancy. Mild canal stenosis. Moderate to severe RIGHT and mild LEFT neural foraminal narrowing.  L5-S1: Small broad-based disc bulge. Minimal facet arthropathy without canal stenosis. Moderate RIGHT neural foraminal narrowing.  IMPRESSION: 1. No fracture. Grade 1 L4-5 anterolisthesis without spondylolysis. Minimal grade 1 L2-3 retrolisthesis. 2. Mild canal stenosis L3-4 and L4-5. 3. Neural foraminal narrowing at all lumbar levels: Moderate to severe on the RIGHT at L4-5. 4. 8 mm nodule along cauda equina at the level sacrum, probable schwannoma which could be confirmed with contrast-enhanced MRI.   Electronically Signed   By: Awilda Metroourtnay  Bloomer M.D.   On: 06/03/2018 03:16   She reports that she has never smoked. She has never used smokeless tobacco. No results for input(s): HGBA1C, LABURIC in the last 8760 hours.  Objective:  VS:  HT:    WT:   BMI:     BP:(!) 145/67  HR:89bpm  TEMP: ( )  RESP:  Physical Exam  Ortho Exam Imaging: No results found.  Past Medical/Family/Surgical/Social History: Medications & Allergies reviewed per EMR, new medications updated. Patient Active Problem List   Diagnosis Date Noted  . Acute medial meniscal tear 02/15/2016  . Palpitations   . Hyperlipidemia   . GERD (gastroesophageal reflux disease)   . Hypertension   .  Hypercholesteremia    Past Medical History:  Diagnosis Date  . GERD (gastroesophageal reflux disease)   . Hyperlipidemia   . Hypertension    Family History  Problem Relation Age of Onset  . Heart disease Unknown        No family history   Past Surgical History:  Procedure Laterality  Date  . COLONOSCOPY    . ESOPHAGOGASTRODUODENOSCOPY    . KNEE ARTHROSCOPY WITH MEDIAL MENISECTOMY Left 02/15/2016   Procedure: LEFT KNEE ARTHROSCOPY WITH MEDIAL MENISECTOMY;  Surgeon: Cammy Copa, MD;  Location: [facility] OR;  Service: Orthopedics;  Laterality: Left;  LEFT KNEE DIAGNOSTIC OPERATIVE ARHTROSCOPY, DEBRIDEMENT.  Marland Kitchen VAGINA SURGERY  2000   Social History   Occupational History    Employer: RETIRED  Tobacco Use  . Smoking status: Never Smoker  . Smokeless tobacco: Never Used  Substance and Sexual Activity  . Alcohol use: No  . Drug use: No  . Sexual activity: Not on file

## 2019-07-28 NOTE — Procedures (Signed)
Lumbosacral Transforaminal Epidural Steroid Injection - Sub-Pedicular Approach with Fluoroscopic Guidance  Patient: Ariana Roberts      Date of Birth: [birth year] MRN: [id number] PCP: Everardo Beals, NP      Visit Date: 07/28/2019   Universal Protocol:    Date/Time: 07/28/2019  Consent Given By: the patient  Position: PRONE  Additional Comments: Vital signs were monitored before and after the procedure. Patient was prepped and draped in the usual sterile fashion. The correct patient, procedure, and site was verified.   Injection Procedure Details:  Procedure Site One Meds Administered:  Meds ordered this encounter  Medications  . DISCONTD: methylPREDNISolone acetate (DEPO-MEDROL) injection 80 mg  . betamethasone acetate-betamethasone sodium phosphate (CELESTONE) injection 12 mg    Laterality: Left  Location/Site:  L4-L5  Needle size: 22 G  Needle type: Spinal  Needle Placement: Transforaminal  Findings:    -Comments: Excellent flow of contrast along the nerve and into the epidural space.  Procedure Details: After squaring off the end-plates to get a true AP view, the C-arm was positioned so that an oblique view of the foramen as noted above was visualized. The target area is just inferior to the "nose of the scotty dog" or sub pedicular. The soft tissues overlying this structure were infiltrated with 2-3 ml. of 1% Lidocaine without Epinephrine.  The spinal needle was inserted toward the target using a "trajectory" view along the fluoroscope beam.  Under AP and lateral visualization, the needle was advanced so it did not puncture dura and was located close the 6 O'Clock position of the pedical in AP tracterory. Biplanar projections were used to confirm position. Aspiration was confirmed to be negative for CSF and/or blood. A 1-2 ml. volume of Isovue-250 was injected and flow of contrast was noted at each level. Radiographs were obtained for documentation purposes.    After attaining the desired flow of contrast documented above, a 0.5 to 1.0 ml test dose of 0.25% Marcaine was injected into each respective transforaminal space.  The patient was observed for 90 seconds post injection.  After no sensory deficits were reported, and normal lower extremity motor function was noted,   the above injectate was administered so that equal amounts of the injectate were placed at each foramen (level) into the transforaminal epidural space.   Additional Comments:  The patient tolerated the procedure well Dressing: 2 x 2 sterile gauze and Band-Aid    Post-procedure details: Patient was observed during the procedure. Post-procedure instructions were reviewed.  Patient left the clinic in stable condition.

## 2019-07-28 NOTE — Progress Notes (Signed)
   Numeric Pain Rating Scale and Functional Assessment Average Pain 10   In the last MONTH (on 0-10 scale) has pain interfered with the following?  1. General activity like being  able to carry out your everyday physical activities such as walking, climbing stairs, carrying groceries, or moving a chair?  Rating(8)   +Driver, -BT, -Dye Allergies.  

## 2020-02-12 ENCOUNTER — Encounter: Payer: Self-pay | Admitting: Orthopedic Surgery

## 2020-02-12 ENCOUNTER — Ambulatory Visit (INDEPENDENT_AMBULATORY_CARE_PROVIDER_SITE_OTHER): Payer: Medicare Other | Admitting: Orthopedic Surgery

## 2020-02-12 DIAGNOSIS — M47816 Spondylosis without myelopathy or radiculopathy, lumbar region: Secondary | ICD-10-CM | POA: Diagnosis not present

## 2020-02-12 DIAGNOSIS — M48061 Spinal stenosis, lumbar region without neurogenic claudication: Secondary | ICD-10-CM | POA: Diagnosis not present

## 2020-02-12 NOTE — Progress Notes (Signed)
Office Visit Note   Patient: Ariana Roberts           Date of Birth: Jan 30, 1935           MRN: [id number] Visit Date: 02/12/2020 Requested by: Everardo Beals, NP [place],  [place] [address] PCP: Everardo Beals, NP  Subjective: Chief Complaint  Patient presents with  . Lower Back - Pain    HPI: Ariana Roberts is a 85 y.o. female who presents to the office complaining of low back pain.  Patient has a history of low back pain.  She notes that she is having worse pain in the left low back.  Pain does not typically bother her at rest or wake her up at night but does bother her when she rolls when laying down at especially when she bends forward.  She denies any leg symptoms, radicular pain, numbness/tingling.  She is unable to lift or carry anything without pain in her back.  She denies taking any medications for pain.  She is treating her pain with rest which has helped.  She has had an MRI of her lumbar spine in September 2019 that revealed grade 1 L4-L5 anterolisthesis with mild canal stenosis at L3-L4 and L4-L5.  Additionally there was neuroforaminal narrowing at all lumbar levels but moderate to severe on the right at L4-L5.  8 mm nodule was identified which is a probable schwannoma..                ROS:  All systems reviewed are negative as they relate to the chief complaint within the history of present illness.  Patient denies fevers or chills.  Assessment & Plan: Visit Diagnoses:  1. Facet arthropathy, lumbar   2. Spinal stenosis of lumbar region without neurogenic claudication     Plan: Patient is an 85 year old female who presents complaining of low back pain.  She has a long history of low back pain.  Pain is worse on her left side.  She has no red flag symptoms and no radicular pain.  No weakness in her legs on exam.  She has had good relief from ESI's in the past but her last injection only provided 8 days of relief.  Plan refer to Dr. Laurence Spates for ESI's.  If  these do not provide significant relief then patient may have to consider living with the pain versus surgical intervention.  Patient and son agree with plan.  Follow-Up Instructions: No follow-ups on file.   Orders:  Orders Placed This Encounter  Procedures  . Ambulatory referral to Physical Medicine Rehab   No orders of the defined types were placed in this encounter.     Procedures: No procedures performed   Clinical Data: No additional findings.  Objective: Vital Signs: There were no vitals taken for this visit.  Physical Exam:  Constitutional: Patient appears well-developed HEENT:  Head: Normocephalic Eyes:EOM are normal Neck: Normal range of motion Cardiovascular: Normal rate Pulmonary/chest: Effort normal Neurologic: Patient is alert Skin: Skin is warm Psychiatric: Patient has normal mood and affect  Ortho Exam:  Tenderness to palpation through the axial lumbar spine and worse over the left paraspinal musculature.  Mild tenderness to palpation over the left trochanteric bursa.  No tenderness to palpation of the right trochanteric bursa.  Sensation intact through all dermatomes of the bilateral lower extremities.  5/5 motor strength of the bilateral hip flexors, quadriceps, hamstring, dorsiflexion, plantarflexion.  Worse pain elicited with flexion of the spine.  No significant  worsening of pain with extension of the spine.  Specialty Comments:  No specialty comments available.  Imaging: No results found.   PMFS History: Patient Active Problem List   Diagnosis Date Noted  . Acute medial meniscal tear 02/15/2016  . Palpitations   . Hyperlipidemia   . GERD (gastroesophageal reflux disease)   . Hypertension   . Hypercholesteremia    Past Medical History:  Diagnosis Date  . GERD (gastroesophageal reflux disease)   . Hyperlipidemia   . Hypertension     Family History  Problem Relation Age of Onset  . Heart disease Unknown        No family history     Past Surgical History:  Procedure Laterality Date  . COLONOSCOPY    . ESOPHAGOGASTRODUODENOSCOPY    . KNEE ARTHROSCOPY WITH MEDIAL MENISECTOMY Left 02/15/2016   Procedure: LEFT KNEE ARTHROSCOPY WITH MEDIAL MENISECTOMY;  Surgeon: Cammy Copa, MD;  Location: [facility] OR;  Service: Orthopedics;  Laterality: Left;  LEFT KNEE DIAGNOSTIC OPERATIVE ARHTROSCOPY, DEBRIDEMENT.  Marland Kitchen VAGINA SURGERY  2000   Social History   Occupational History    Employer: RETIRED  Tobacco Use  . Smoking status: Never Smoker  . Smokeless tobacco: Never Used  Substance and Sexual Activity  . Alcohol use: No  . Drug use: No  . Sexual activity: Not on file

## 2020-03-05 ENCOUNTER — Ambulatory Visit (INDEPENDENT_AMBULATORY_CARE_PROVIDER_SITE_OTHER): Payer: Medicare Other | Admitting: Podiatry

## 2020-03-05 ENCOUNTER — Other Ambulatory Visit: Payer: Self-pay

## 2020-03-05 ENCOUNTER — Encounter: Payer: Self-pay | Admitting: Podiatry

## 2020-03-05 DIAGNOSIS — M7752 Other enthesopathy of left foot: Secondary | ICD-10-CM | POA: Diagnosis not present

## 2020-03-05 DIAGNOSIS — J0100 Acute maxillary sinusitis, unspecified: Secondary | ICD-10-CM | POA: Insufficient documentation

## 2020-03-05 DIAGNOSIS — L603 Nail dystrophy: Secondary | ICD-10-CM | POA: Diagnosis not present

## 2020-03-05 DIAGNOSIS — R490 Dysphonia: Secondary | ICD-10-CM | POA: Insufficient documentation

## 2020-03-05 NOTE — Progress Notes (Signed)
She presents today with her son for interpretation with chief concern of discolored toenail hallux right and pain to the PIPJ of the hallux right.  Objective: Vital signs are stable she alert oriented x3 pulses remain palpable.  Nail dystrophy to hallux right no signs of infection.  She has pain to palpation at the level of the hallux interphalangeal joint no x-rays were taken today but it does feel like there is some spurring present.  This is most likely consistent with osteoarthritis.  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  Also noted nail dystrophy hallux right.  Plan: Smoothed down the nail dystrophy for her today.  She is happy with this.  I also injected the toe with 2 mg of dexamethasone local anesthetic and I will follow-up with her as needed.

## 2020-03-29 ENCOUNTER — Encounter: Payer: Self-pay | Admitting: Physical Medicine and Rehabilitation

## 2020-03-29 ENCOUNTER — Other Ambulatory Visit: Payer: Self-pay

## 2020-03-29 ENCOUNTER — Ambulatory Visit: Payer: Self-pay

## 2020-03-29 ENCOUNTER — Ambulatory Visit (INDEPENDENT_AMBULATORY_CARE_PROVIDER_SITE_OTHER): Payer: Medicare Other | Admitting: Physical Medicine and Rehabilitation

## 2020-03-29 VITALS — BP 126/67 | HR 80

## 2020-03-29 DIAGNOSIS — M47816 Spondylosis without myelopathy or radiculopathy, lumbar region: Secondary | ICD-10-CM

## 2020-03-29 MED ORDER — BUPIVACAINE HCL 0.5 % IJ SOLN: 3.0000 mL | Freq: Once | INTRAMUSCULAR | Status: AC

## 2020-03-29 MED ORDER — METHYLPREDNISOLONE ACETATE 80 MG/ML IJ SUSP
80.0000 mg | Freq: Once | INTRAMUSCULAR | Status: AC
  Administered 2020-03-29: 80 mg

## 2020-03-29 NOTE — Progress Notes (Signed)
Pt states left lower back pain. Pt states blending long time. Pt states laying and sitting makes the pain better. Pt has hx of inj on 07/28/19 I little relief for a few weeks.   Numeric Pain Rating Scale and Functional Assessment Average Pain 3   In the last MONTH (on 0-10 scale) has pain interfered with the following?  1. General activity like being  able to carry out your everyday physical activities such as walking, climbing stairs, carrying groceries, or moving a chair?  Rating(8)   +Driver, -BT, -Dye Allergies.

## 2020-03-30 ENCOUNTER — Encounter: Payer: Self-pay | Admitting: Physical Medicine and Rehabilitation

## 2020-03-30 NOTE — Procedures (Signed)
Lumbar Facet Joint Intra-Articular Injection(s) with Fluoroscopic Guidance  Patient: Ariana Roberts      Date of Birth: Sep 20, 1934 MRN: [id number] PCP: Marva Panda, NP      Visit Date: 03/29/2020   Universal Protocol:    Date/Time: 03/29/2020  Consent Given By: the patient  Position: PRONE   Additional Comments: Vital signs were monitored before and after the procedure. Patient was prepped and draped in the usual sterile fashion. The correct patient, procedure, and site was verified.   Injection Procedure Details:  Procedure Site One Meds Administered:  Meds ordered this encounter  Medications  . bupivacaine (MARCAINE) 0.5 % (with pres) injection 3 mL  . methylPREDNISolone acetate (DEPO-MEDROL) injection 80 mg     Laterality: Bilateral  Location/Site:  L4-L5  Needle size: 22 guage  Needle type: Spinal  Needle Placement: Articular  Findings:  -Comments: Excellent flow of contrast producing a partial arthrogram.  Procedure Details: The fluoroscope beam is vertically oriented in AP, and the inferior recess is visualized beneath the lower pole of the inferior apophyseal process, which represents the target point for needle insertion. When direct visualization is difficult the target point is located at the medial projection of the vertebral pedicle. The region overlying each aforementioned target is locally anesthetized with a 1 to 2 ml. volume of 1% Lidocaine without Epinephrine.   The spinal needle was inserted into each of the above mentioned facet joints using biplanar fluoroscopic guidance. A 0.25 to 0.5 ml. volume of Isovue-250 was injected and a partial facet joint arthrogram was obtained. A single spot film was obtained of the resulting arthrogram.    One to 1.25 ml of the steroid/anesthetic solution was then injected into each of the facet joints noted above.   Additional Comments:  The patient tolerated the procedure well Dressing: 2 x 2 sterile gauze and  Band-Aid    Post-procedure details: Patient was observed during the procedure. Post-procedure instructions were reviewed.  Patient left the clinic in stable condition.

## 2020-03-30 NOTE — Progress Notes (Signed)
Ariana Roberts - 85 y.o. female MRN [id number]  Date of birth: 1935-08-18  Office Visit Note: Visit Date: 03/29/2020 PCP: Marva Panda, NP Referred by: Marva Panda, NP  Subjective: Chief Complaint  Patient presents with  . Lower Back - Pain   HPI:  Ariana Roberts is a 85 y.o. female who comes in today at the request of Dr. Burnard Bunting for planned Bilateral L4-L5 Lumbar facet/medial branch block with fluoroscopic guidance.  The patient has failed conservative care including home exercise, medications, time and activity modification.  This injection will be diagnostic and hopefully therapeutic.  Please see requesting physician notes for further details and justification.    Exam shows concordant low back pain with facet joint loading and extension.  Patient is here with her son who provides some of the history and also does help with translation although she does understand [place].  She reports axial low back pain going from sit to stand with standing for prolonged periods.  Prior injection in November of last year helped for a little while is very hard to get from them how much it helped her really how long.  It has been quite a while though since we have seen her.  She is followed up with Dr. August Saucer and they felt like this was more facet mediated pain and this was what I had talked about with her the last time I saw her.  She reports that if this injection does not seem to help she is going to seek surgical referral which I think is fine.  She does have a small listhesis and facet arthropathy.   ROS Otherwise per HPI.  Assessment & Plan: Visit Diagnoses:  1. Spondylosis without myelopathy or radiculopathy, lumbar region     Plan: No additional findings.   Meds & Orders:  Meds ordered this encounter  Medications  . bupivacaine (MARCAINE) 0.5 % (with pres) injection 3 mL  . methylPREDNISolone acetate (DEPO-MEDROL) injection 80 mg    Orders Placed This Encounter  Procedures  .  Facet Injection  . XR C-ARM NO REPORT    Follow-up: Return for visit to requesting physician as needed.   Procedures: No procedures performed  Lumbar Facet Joint Intra-Articular Injection(s) with Fluoroscopic Guidance  Patient: Ariana Roberts      Date of Birth: 18-Sep-1934 MRN: [id number] PCP: Marva Panda, NP      Visit Date: 03/29/2020   Universal Protocol:    Date/Time: 03/29/2020  Consent Given By: the patient  Position: PRONE   Additional Comments: Vital signs were monitored before and after the procedure. Patient was prepped and draped in the usual sterile fashion. The correct patient, procedure, and site was verified.   Injection Procedure Details:  Procedure Site One Meds Administered:  Meds ordered this encounter  Medications  . bupivacaine (MARCAINE) 0.5 % (with pres) injection 3 mL  . methylPREDNISolone acetate (DEPO-MEDROL) injection 80 mg     Laterality: Bilateral  Location/Site:  L4-L5  Needle size: 22 guage  Needle type: Spinal  Needle Placement: Articular  Findings:  -Comments: Excellent flow of contrast producing a partial arthrogram.  Procedure Details: The fluoroscope beam is vertically oriented in AP, and the inferior recess is visualized beneath the lower pole of the inferior apophyseal process, which represents the target point for needle insertion. When direct visualization is difficult the target point is located at the medial projection of the vertebral pedicle. The region overlying each aforementioned target is locally anesthetized with a 1 to 2 ml.  volume of 1% Lidocaine without Epinephrine.   The spinal needle was inserted into each of the above mentioned facet joints using biplanar fluoroscopic guidance. A 0.25 to 0.5 ml. volume of Isovue-250 was injected and a partial facet joint arthrogram was obtained. A single spot film was obtained of the resulting arthrogram.    One to 1.25 ml of the steroid/anesthetic solution was then injected  into each of the facet joints noted above.   Additional Comments:  The patient tolerated the procedure well Dressing: 2 x 2 sterile gauze and Band-Aid    Post-procedure details: Patient was observed during the procedure. Post-procedure instructions were reviewed.  Patient left the clinic in stable condition.     Clinical History: MRI LUMBAR SPINE WITHOUT CONTRAST  TECHNIQUE: Multiplanar, multisequence MR imaging of the lumbar spine was performed. No intravenous contrast was administered.  COMPARISON:  Lumbar spine radiographs June 06, 2017  FINDINGS: SEGMENTATION: For the purposes of this report, the last well-formed intervertebral disc is reported as L5-S1.  ALIGNMENT: Maintained lumbar lordosis. Grade 1 L4-5 anterolisthesis without spondylolysis. Minimal grade 1 L2-3 retrolisthesis.  VERTEBRAE:Vertebral bodies are intact. Intervertebral discs demonstrate normal morphology and signal characteristics. No abnormal bone marrow signal.  CONUS MEDULLARIS AND CAUDA EQUINA: Conus medullaris terminates at L1-2 and demonstrates normal morphology and signal characteristics. 8 mm low signal intracanalicular nodule along the cauda equina at the level of S2.  PARASPINAL AND OTHER SOFT TISSUES: Nonacute.  DISC LEVELS:  T12-L1: Small broad-based disc bulge asymmetric to LEFT. No canal stenosis. Moderate LEFT neural foraminal narrowing.  L1-2: Small broad-based disc bulge asymmetric to the LEFT. No canal stenosis. Mild LEFT neural foraminal narrowing.  L2-3: Retrolisthesis. Moderate broad-based disc bulge. No canal stenosis. Mild bilateral neural foraminal narrowing.  L3-4: Small broad-based disc bulge. Minimal facet arthropathy and ligamentum flavum redundancy. Mild canal stenosis. Moderate RIGHT and mild LEFT neural foraminal narrowing.  L4-5: Anterolisthesis. Small broad-based disc bulge with unroofing of the disc. Moderate to severe RIGHT and mild LEFT  facet arthropathy and ligamentum flavum redundancy. Mild canal stenosis. Moderate to severe RIGHT and mild LEFT neural foraminal narrowing.  L5-S1: Small broad-based disc bulge. Minimal facet arthropathy without canal stenosis. Moderate RIGHT neural foraminal narrowing.  IMPRESSION: 1. No fracture. Grade 1 L4-5 anterolisthesis without spondylolysis. Minimal grade 1 L2-3 retrolisthesis. 2. Mild canal stenosis L3-4 and L4-5. 3. Neural foraminal narrowing at all lumbar levels: Moderate to severe on the RIGHT at L4-5. 4. 8 mm nodule along cauda equina at the level sacrum, probable schwannoma which could be confirmed with contrast-enhanced MRI.   Electronically Signed   By: Awilda Metro M.D.   On: 06/03/2018 03:16     Objective:  VS:  HT:    WT:   BMI:     BP:126/67  HR:80bpm  TEMP: ( )  RESP:  Physical Exam Constitutional:      General: She is not in acute distress.    Appearance: Normal appearance. She is not ill-appearing.  HENT:     Head: Normocephalic and atraumatic.     Right Ear: External ear normal.     Left Ear: External ear normal.  Eyes:     Extraocular Movements: Extraocular movements intact.  Cardiovascular:     Rate and Rhythm: Normal rate.     Pulses: Normal pulses.  Musculoskeletal:     Right lower leg: No edema.     Left lower leg: No edema.     Comments: Patient has good distal strength with no pain  over the greater trochanters.  No clonus or focal weakness.  Skin:    Findings: No erythema, lesion or rash.  Neurological:     General: No focal deficit present.     Mental Status: She is alert and oriented to person, place, and time.     Sensory: No sensory deficit.     Motor: No weakness or abnormal muscle tone.     Coordination: Coordination normal.  Psychiatric:        Mood and Affect: Mood normal.        Behavior: Behavior normal.      Imaging: XR C-ARM NO REPORT  Result Date: 03/29/2020 Please see Notes tab for imaging  impression.

## 2020-05-05 ENCOUNTER — Ambulatory Visit (INDEPENDENT_AMBULATORY_CARE_PROVIDER_SITE_OTHER): Payer: Medicare Other | Admitting: Orthopedic Surgery

## 2020-05-05 DIAGNOSIS — M47816 Spondylosis without myelopathy or radiculopathy, lumbar region: Secondary | ICD-10-CM | POA: Diagnosis not present

## 2020-05-05 DIAGNOSIS — M48061 Spinal stenosis, lumbar region without neurogenic claudication: Secondary | ICD-10-CM

## 2020-05-09 ENCOUNTER — Encounter: Payer: Self-pay | Admitting: Orthopedic Surgery

## 2020-05-09 NOTE — Progress Notes (Signed)
Office Visit Note   Patient: Ariana Roberts           Date of Birth: 1934-09-08           MRN: [id number] Visit Date: 05/05/2020 Requested by: Marva Panda, NP [address] [place],  [place] [address] PCP: Marva Panda, NP  Subjective: Chief Complaint  Patient presents with  . Back Pain    HPI: Ariana Roberts is an 85 year old patient with back pain.  She had a lumbar facet injection 03/29/2020 which gave her about 30% relief.  Still is having left-sided pain.  Ambulating with a cane.  She states that the shots help but does not make the pain go all the way away.  When she sits down the pain improves.              ROS: All systems reviewed are negative as they relate to the chief complaint within the history of present illness.  Patient denies  fevers or chills.   Assessment & Plan: Visit Diagnoses:  1. Facet arthropathy, lumbar   2. Spinal stenosis of lumbar region without neurogenic claudication     Plan: Impression is facet arthritis and left-sided back pain in an 85 year old patient who would be willing to consider surgical intervention.  I do not think surgery is necessarily the step I would go to right away.  I think in general she is managing fairly well.  I think would be good to consider ablation treatment with Dr. Alvester Morin.  If that is not really in the cards then referral to Dr. Ophelia Charter could be made but it is unlikely that he would do any type of surgical intervention.  Follow-up with [place] as needed.  Follow-Up Instructions: No follow-ups on file.   Orders:  Orders Placed This Encounter  Procedures  . Ambulatory referral to Physical Medicine Rehab   No orders of the defined types were placed in this encounter.     Procedures: No procedures performed   Clinical Data: No additional findings.  Objective: Vital Signs: There were no vitals taken for this visit.  Physical Exam:   Constitutional: Patient appears well-developed HEENT:  Head:  Normocephalic Eyes:EOM are normal Neck: Normal range of motion Cardiovascular: Normal rate Pulmonary/chest: Effort normal Neurologic: Patient is alert Skin: Skin is warm Psychiatric: Patient has normal mood and affect    Ortho Exam: Ortho exam demonstrates full active and passive range of motion of knees ankles and hips with no nerve root tension signs.  No muscle atrophy.  Reflexes symmetric.  No other masses lymphadenopathy or skin changes noted in the back region.  No definite paresthesias L1-S1 bilaterally.  Specialty Comments:  No specialty comments available.  Imaging: No results found.   PMFS History: Patient Active Problem List   Diagnosis Date Noted  . Acute maxillary sinusitis 03/05/2020  . Hoarse 03/05/2020  . Irritant dermatitis 07/25/2018  . Seborrheic keratosis 07/25/2018  . Laryngopharyngeal reflux (LPR) 02/28/2017  . Perennial allergic rhinitis 02/28/2017  . Acute medial meniscal tear 02/15/2016  . Palpitations   . Hyperlipidemia   . GERD (gastroesophageal reflux disease)   . Hypertension   . Hypercholesteremia   . Pain in throat 06/04/2012   Past Medical History:  Diagnosis Date  . GERD (gastroesophageal reflux disease)   . Hyperlipidemia   . Hypertension     Family History  Problem Relation Age of Onset  . Heart disease Unknown        No family history    Past Surgical  History:  Procedure Laterality Date  . COLONOSCOPY    . ESOPHAGOGASTRODUODENOSCOPY    . KNEE ARTHROSCOPY WITH MEDIAL MENISECTOMY Left 02/15/2016   Procedure: LEFT KNEE ARTHROSCOPY WITH MEDIAL MENISECTOMY;  Surgeon: Cammy Copa, MD;  Location: [facility] OR;  Service: Orthopedics;  Laterality: Left;  LEFT KNEE DIAGNOSTIC OPERATIVE ARHTROSCOPY, DEBRIDEMENT.  Marland Kitchen VAGINA SURGERY  2000   Social History   Occupational History    Employer: RETIRED  Tobacco Use  . Smoking status: Never Smoker  . Smokeless tobacco: Never Used  Substance and Sexual Activity  . Alcohol use: No  . Drug  use: No  . Sexual activity: Not on file

## 2020-06-14 ENCOUNTER — Other Ambulatory Visit: Payer: Self-pay

## 2020-06-14 ENCOUNTER — Ambulatory Visit: Payer: Self-pay

## 2020-06-14 ENCOUNTER — Encounter: Payer: Self-pay | Admitting: Physical Medicine and Rehabilitation

## 2020-06-14 ENCOUNTER — Ambulatory Visit (INDEPENDENT_AMBULATORY_CARE_PROVIDER_SITE_OTHER): Payer: Medicare (Managed Care) | Admitting: Physical Medicine and Rehabilitation

## 2020-06-14 VITALS — BP 142/66 | HR 86

## 2020-06-14 DIAGNOSIS — M47816 Spondylosis without myelopathy or radiculopathy, lumbar region: Secondary | ICD-10-CM | POA: Diagnosis not present

## 2020-06-14 MED ORDER — METHYLPREDNISOLONE ACETATE 80 MG/ML IJ SUSP: 80.0000 mg | Freq: Once | INTRAMUSCULAR | Status: DC

## 2020-06-14 NOTE — Progress Notes (Signed)
Pt state lower back pain. Pt state walking or cleaning  house makes the pain worse. Pt state she has to sit down to ease the pain. Pt has hx of inj on 03/29/20   Numeric Pain Rating Scale and Functional Assessment Average Pain 0   In the last MONTH (on 0-10 scale) has pain interfered with the following?  1. General activity like being  able to carry out your everyday physical activities such as walking, climbing stairs, carrying groceries, or moving a chair?  Rating(7)   +Driver, -BT, -Dye Allergies.

## 2020-06-22 NOTE — Procedures (Signed)
Lumbar Facet Joint Nerve Denervation  Patient: Ariana Roberts      Date of Birth: 1935/01/01 MRN: [id number] PCP: Marva Panda, NP      Visit Date: 06/14/2020   Universal Protocol:    Date/Time: 10/19/215:49 AM  Consent Given By: the patient  Position: PRONE  Additional Comments: Vital signs were monitored before and after the procedure. Patient was prepped and draped in the usual sterile fashion. The correct patient, procedure, and site was verified.   Injection Procedure Details:   Procedure diagnoses:  1. Spondylosis without myelopathy or radiculopathy, lumbar region      Meds Administered:  Meds ordered this encounter  Medications  . methylPREDNISolone acetate (DEPO-MEDROL) injection 80 mg     Laterality: Bilateral  Location/Site:  L4-L5  Needle size: 18 G  Needle type: Radiofrequency cannula  Needle Placement: Along juncture of superior articular process and transverse pocess  Findings:  -Comments:  Procedure Details: For each desired target nerve, the corresponding transverse process (sacral ala for the L5 dorsal rami) was identified and the fluoroscope was positioned to square off the endplates of the corresponding vertebral body to achieve a true AP midline view.  The beam was then obliqued 15 to 20 degrees and caudally tilted 15 to 20 degrees to line up a trajectory along the target nerves. The skin over the target of the junction of superior articulating process and transverse process (sacral ala for the L5 dorsal rami) was infiltrated with 15ml of 1% Lidocaine without Epinephrine.  The 18 gauge 18mm active tip outer cannula was advanced in trajectory view to the target.  This procedure was repeated for each target nerve.  Then, for all levels, the outer cannula placement was fine-tuned and the position was then confirmed with bi-planar imaging.    Test stimulation was done both at sensory and motor levels to ensure there was no radicular stimulation. The  target tissues were then infiltrated with 1 ml of 1% Lidocaine without Epinephrine. Subsequently, a percutaneous neurotomy was carried out for 90 seconds at 80 degrees Celsius.  After the completion of the lesion, 1 ml of injectate was delivered. It was then repeated for each facet joint nerve mentioned above. Appropriate radiographs were obtained to verify the probe placement during the neurotomy.   Additional Comments:  No complications occurred Dressing: 2 x 2 sterile gauze and Band-Aid    Post-procedure details: Patient was observed during the procedure. Post-procedure instructions were reviewed.  Patient left the clinic in stable condition.

## 2020-06-22 NOTE — Progress Notes (Signed)
Ariana Roberts - 85 y.o. female MRN [id number]  Date of birth: December 25, 1934  Office Visit Note: Visit Date: 06/14/2020 PCP: Marva Panda, NP Referred by: Marva Panda, NP  Subjective: No chief complaint on file.  HPI:  Ariana Roberts is a 85 y.o. female who comes in today for planned radiofrequency ablation of the Bilateral L4-L5 Lumbar facet joints. This would be ablation of the corresponding medial branches and/or dorsal rami.  Patient has had double diagnostic blocks with more than 50% relief.  These are documented on pain diary.  They have had chronic back pain for quite some time, more than 3 months, which has been an ongoing situation with recalcitrant axial back pain.  They have no radicular pain.  Their axial pain is worse with standing and ambulating and on exam today with facet loading.  They have had physical therapy as well as home exercise program.  The imaging noted in the chart below indicated facet pathology. Accordingly they meet all the criteria and qualification for for radiofrequency ablation and we are going to complete this today hopefully for more longer term relief as part of comprehensive management program.  Patient son acts as her interpreter.  He has done this since we have seen her.  He is a pleasant fellow that does seem to do a good job of translating all the medical issues to her.  All his questions were answered today.   ROS Otherwise per HPI.  Assessment & Plan: Visit Diagnoses:  1. Spondylosis without myelopathy or radiculopathy, lumbar region     Plan: No additional findings.   Meds & Orders:  Meds ordered this encounter  Medications  . methylPREDNISolone acetate (DEPO-MEDROL) injection 80 mg    Orders Placed This Encounter  Procedures  . Radiofrequency,Lumbar  . XR C-ARM NO REPORT    Follow-up: Return in about 4 weeks (around 07/12/2020).   Procedures: No procedures performed  Lumbar Facet Joint Nerve Denervation  Patient: Ariana Roberts      Date of Birth: 1934-09-16 MRN: [id number] PCP: Marva Panda, NP      Visit Date: 06/14/2020   Universal Protocol:    Date/Time: 10/19/215:49 AM  Consent Given By: the patient  Position: PRONE  Additional Comments: Vital signs were monitored before and after the procedure. Patient was prepped and draped in the usual sterile fashion. The correct patient, procedure, and site was verified.   Injection Procedure Details:   Procedure diagnoses:  1. Spondylosis without myelopathy or radiculopathy, lumbar region      Meds Administered:  Meds ordered this encounter  Medications  . methylPREDNISolone acetate (DEPO-MEDROL) injection 80 mg     Laterality: Bilateral  Location/Site:  L4-L5  Needle size: 18 G  Needle type: Radiofrequency cannula  Needle Placement: Along juncture of superior articular process and transverse pocess  Findings:  -Comments:  Procedure Details: For each desired target nerve, the corresponding transverse process (sacral ala for the L5 dorsal rami) was identified and the fluoroscope was positioned to square off the endplates of the corresponding vertebral body to achieve a true AP midline view.  The beam was then obliqued 15 to 20 degrees and caudally tilted 15 to 20 degrees to line up a trajectory along the target nerves. The skin over the target of the junction of superior articulating process and transverse process (sacral ala for the L5 dorsal rami) was infiltrated with 21ml of 1% Lidocaine without Epinephrine.  The 18 gauge 23mm active tip outer cannula was advanced in trajectory view  to the target.  This procedure was repeated for each target nerve.  Then, for all levels, the outer cannula placement was fine-tuned and the position was then confirmed with bi-planar imaging.    Test stimulation was done both at sensory and motor levels to ensure there was no radicular stimulation. The target tissues were then infiltrated with 1 ml of 1%  Lidocaine without Epinephrine. Subsequently, a percutaneous neurotomy was carried out for 90 seconds at 80 degrees Celsius.  After the completion of the lesion, 1 ml of injectate was delivered. It was then repeated for each facet joint nerve mentioned above. Appropriate radiographs were obtained to verify the probe placement during the neurotomy.   Additional Comments:  No complications occurred Dressing: 2 x 2 sterile gauze and Band-Aid    Post-procedure details: Patient was observed during the procedure. Post-procedure instructions were reviewed.  Patient left the clinic in stable condition.        Clinical History: MRI LUMBAR SPINE WITHOUT CONTRAST  TECHNIQUE: Multiplanar, multisequence MR imaging of the lumbar spine was performed. No intravenous contrast was administered.  COMPARISON:  Lumbar spine radiographs June 06, 2017  FINDINGS: SEGMENTATION: For the purposes of this report, the last well-formed intervertebral disc is reported as L5-S1.  ALIGNMENT: Maintained lumbar lordosis. Grade 1 L4-5 anterolisthesis without spondylolysis. Minimal grade 1 L2-3 retrolisthesis.  VERTEBRAE:Vertebral bodies are intact. Intervertebral discs demonstrate normal morphology and signal characteristics. No abnormal bone marrow signal.  CONUS MEDULLARIS AND CAUDA EQUINA: Conus medullaris terminates at L1-2 and demonstrates normal morphology and signal characteristics. 8 mm low signal intracanalicular nodule along the cauda equina at the level of S2.  PARASPINAL AND OTHER SOFT TISSUES: Nonacute.  DISC LEVELS:  T12-L1: Small broad-based disc bulge asymmetric to LEFT. No canal stenosis. Moderate LEFT neural foraminal narrowing.  L1-2: Small broad-based disc bulge asymmetric to the LEFT. No canal stenosis. Mild LEFT neural foraminal narrowing.  L2-3: Retrolisthesis. Moderate broad-based disc bulge. No canal stenosis. Mild bilateral neural foraminal  narrowing.  L3-4: Small broad-based disc bulge. Minimal facet arthropathy and ligamentum flavum redundancy. Mild canal stenosis. Moderate RIGHT and mild LEFT neural foraminal narrowing.  L4-5: Anterolisthesis. Small broad-based disc bulge with unroofing of the disc. Moderate to severe RIGHT and mild LEFT facet arthropathy and ligamentum flavum redundancy. Mild canal stenosis. Moderate to severe RIGHT and mild LEFT neural foraminal narrowing.  L5-S1: Small broad-based disc bulge. Minimal facet arthropathy without canal stenosis. Moderate RIGHT neural foraminal narrowing.  IMPRESSION: 1. No fracture. Grade 1 L4-5 anterolisthesis without spondylolysis. Minimal grade 1 L2-3 retrolisthesis. 2. Mild canal stenosis L3-4 and L4-5. 3. Neural foraminal narrowing at all lumbar levels: Moderate to severe on the RIGHT at L4-5. 4. 8 mm nodule along cauda equina at the level sacrum, probable schwannoma which could be confirmed with contrast-enhanced MRI.   Electronically Signed   By: Awilda Metro M.D.   On: 06/03/2018 03:16     Objective:  VS:  HT:    WT:   BMI:     BP:(!) 142/66  HR:86bpm  TEMP: ( )  RESP:  Physical Exam Constitutional:      General: She is not in acute distress.    Appearance: Normal appearance. She is not ill-appearing.  HENT:     Head: Normocephalic and atraumatic.     Right Ear: External ear normal.     Left Ear: External ear normal.  Eyes:     Extraocular Movements: Extraocular movements intact.  Cardiovascular:     Rate and Rhythm:  Normal rate.     Pulses: Normal pulses.  Musculoskeletal:     Right lower leg: No edema.     Left lower leg: No edema.     Comments: Patient has good distal strength with no pain over the greater trochanters.  No clonus or focal weakness. Patient somewhat slow to rise from a seated position to full extension.  There is concordant low back pain with facet loading and lumbar spine extension rotation.  There are no  definitive trigger points but the patient is somewhat tender across the lower back and PSIS.  There is no pain with hip rotation.   Skin:    Findings: No erythema, lesion or rash.  Neurological:     General: No focal deficit present.     Mental Status: She is alert and oriented to person, place, and time.     Sensory: No sensory deficit.     Motor: No weakness or abnormal muscle tone.     Coordination: Coordination normal.  Psychiatric:        Mood and Affect: Mood normal.        Behavior: Behavior normal.      Imaging: No results found.

## 2020-07-09 ENCOUNTER — Telehealth: Payer: Self-pay | Admitting: Physical Medicine and Rehabilitation

## 2020-07-09 NOTE — Telephone Encounter (Signed)
Called pt son nelson back and they would like to sch pt appt on 11/10 ROV. Pt son state that his mother is feeling better.

## 2020-07-09 NOTE — Telephone Encounter (Signed)
Patient's son Delton See called needing to cancel his mother's appointment. The number to contact Delton See is [phone]

## 2020-07-09 NOTE — Telephone Encounter (Signed)
Called pt and lvm #1 

## 2020-07-14 ENCOUNTER — Ambulatory Visit: Payer: Medicare (Managed Care) | Admitting: Physical Medicine and Rehabilitation

## 2021-06-10 ENCOUNTER — Other Ambulatory Visit: Payer: Self-pay

## 2021-06-10 ENCOUNTER — Ambulatory Visit (INDEPENDENT_AMBULATORY_CARE_PROVIDER_SITE_OTHER): Payer: Medicare (Managed Care) | Admitting: Surgical

## 2021-06-10 ENCOUNTER — Encounter: Payer: Self-pay | Admitting: Surgical

## 2021-06-10 ENCOUNTER — Ambulatory Visit: Payer: Self-pay

## 2021-06-10 DIAGNOSIS — M65341 Trigger finger, right ring finger: Secondary | ICD-10-CM

## 2021-06-10 DIAGNOSIS — M79641 Pain in right hand: Secondary | ICD-10-CM

## 2021-06-12 ENCOUNTER — Encounter: Payer: Self-pay | Admitting: Surgical

## 2021-06-12 MED ORDER — BUPIVACAINE HCL 0.25 % IJ SOLN
0.3300 mL | INTRAMUSCULAR | Status: AC | PRN
  Administered 2021-06-10: .33 mL

## 2021-06-12 MED ORDER — METHYLPREDNISOLONE ACETATE 40 MG/ML IJ SUSP
13.3300 mg | INTRAMUSCULAR | Status: AC | PRN
  Administered 2021-06-10: 13.33 mg

## 2021-06-12 MED ORDER — LIDOCAINE HCL 1 % IJ SOLN
3.0000 mL | INTRAMUSCULAR | Status: AC | PRN
Start: 2021-06-10 — End: 2021-06-10
  Administered 2021-06-10: 3 mL

## 2021-06-12 NOTE — Progress Notes (Addendum)
Office Visit Note   Patient: Shaunette Gassner           Date of Birth: [birth year]           MRN: [id number] Visit Date: 06/10/2021 Requested by: Marva Panda, NP [address] [place],  [place] [address] PCP: Marva Panda, NP  Subjective: Chief Complaint  Patient presents with   Right Hand - Follow-up    HPI: Thena Devora is a 86 y.o. female who presents to the office complaining of right hand ring finger trigger finger.  Patient complains of 5/10 pain.  She has been experiencing symptoms for about 1 month and has tried Voltaren gel over the site of most tenderness without any relief.  She wakes in the morning with the finger locked in a flexed position.  No history of injury.  The locking symptoms are becoming more more frequent.  No symptoms in other fingers.  No numbness or tingling.  No history of surgery to her hand.  She is ambulating with a cane and gripping the cane and gripping other objects with this hand is very painful.  She has never had injection for trigger finger before.  She would like to try this today..                ROS: All systems reviewed are negative as they relate to the chief complaint within the history of present illness.  Patient denies fevers or chills.  Assessment & Plan: Visit Diagnoses:  1. Trigger finger, right ring finger   2. Pain in right hand     Plan: Patient is a 86 year old female who presents for evaluation of right ring trigger finger.  She has had symptoms over the last month.  Began this pain and now she has locking which is becoming more frequent.  She does not have to use the contralateral hand in order to unlock her finger.  No other fingers are affected.  Never had surgery or injection before on this hand.  After discussion of options, she would like to proceed with trigger finger injection today as she has failed topical anti-inflammatories.  Risks of injection were discussed with her including the risk of tendon rupture.  Under  ultrasound guidance injection was successfully delivered.  Plan for her to give this 10 to 14 days to take effect and if she has continued symptoms, follow-up with Dr. August Saucer for repeat injection or consider surgery if the symptoms are bad enough.  Patient and her son Delton See understand that there is about a 50 % success rate with these injections.  Follow-Up Instructions: No follow-ups on file.   Orders:  Orders Placed This Encounter  Procedures   US Guided Needle Placement   No orders of the defined types were placed in this encounter.     Procedures: Hand/UE Inj: R ring A1 for trigger finger on 06/10/2021 11:16 AM Indications: therapeutic Details: 25 G needle, volar approach Medications: 0.33 mL bupivacaine 0.25 %; 13.33 mg methylPREDNISolone acetate 40 MG/ML; 3 mL lidocaine 1 % Outcome: tolerated well, no immediate complications Procedure, treatment alternatives, risks and benefits explained, specific risks discussed. Consent was given by the patient. Immediately prior to procedure a time out was called to verify the correct patient, procedure, equipment, support staff and site/side marked as required. Patient was prepped and draped in the usual sterile fashion.      Clinical Data: No additional findings.  Objective: Vital Signs: There were no vitals taken for this visit.  Physical Exam:  Constitutional: Patient appears well-developed HEENT:  Head: Normocephalic Eyes:EOM are normal Neck: Normal range of motion Cardiovascular: Normal rate Pulmonary/chest: Effort normal Neurologic: Patient is alert Skin: Skin is warm Psychiatric: Patient has normal mood and affect  Ortho Exam: Ortho exam demonstrates right hand with tenderness over the A1 pulley of the right ring finger.  No A1 pulley tenderness of the other fingers.  There is locking that is observed with the ring finger becoming stuck in a flexed position before patient is able to unlock it without using the other hand..  No  contractures noted in the palm.  2+ radial pulse of the right upper extremity.  Specialty Comments:  No specialty comments available.  Imaging: No results found.   PMFS History: Patient Active Problem List   Diagnosis Date Noted   Acute maxillary sinusitis 03/05/2020   Hoarse 03/05/2020   Irritant dermatitis 07/25/2018   Seborrheic keratosis 07/25/2018   Laryngopharyngeal reflux (LPR) 02/28/2017   Perennial allergic rhinitis 02/28/2017   Acute medial meniscal tear 02/15/2016   Palpitations    Hyperlipidemia    GERD (gastroesophageal reflux disease)    Hypertension    Hypercholesteremia    Pain in throat 06/04/2012   Past Medical History:  Diagnosis Date   GERD (gastroesophageal reflux disease)    Hyperlipidemia    Hypertension     Family History  Problem Relation Age of Onset   Heart disease Other        No family history    Past Surgical History:  Procedure Laterality Date   COLONOSCOPY     ESOPHAGOGASTRODUODENOSCOPY     KNEE ARTHROSCOPY WITH MEDIAL MENISECTOMY Left 02/15/2016   Procedure: LEFT KNEE ARTHROSCOPY WITH MEDIAL MENISECTOMY;  Surgeon: Cammy Copa, MD;  Location: [facility] OR;  Service: Orthopedics;  Laterality: Left;  LEFT KNEE DIAGNOSTIC OPERATIVE ARHTROSCOPY, DEBRIDEMENT.   VAGINA SURGERY  2000   Social History   Occupational History    Employer: RETIRED  Tobacco Use   Smoking status: Never   Smokeless tobacco: Never  Substance and Sexual Activity   Alcohol use: No   Drug use: No   Sexual activity: Not on file

## 2021-09-23 ENCOUNTER — Ambulatory Visit: Payer: Medicare (Managed Care) | Admitting: Orthopedic Surgery

## 2021-10-12 ENCOUNTER — Ambulatory Visit: Payer: Medicare (Managed Care) | Admitting: Orthopedic Surgery

## 2021-11-03 ENCOUNTER — Ambulatory Visit (INDEPENDENT_AMBULATORY_CARE_PROVIDER_SITE_OTHER): Payer: Medicare (Managed Care)

## 2021-11-03 ENCOUNTER — Ambulatory Visit (INDEPENDENT_AMBULATORY_CARE_PROVIDER_SITE_OTHER): Payer: Medicare (Managed Care) | Admitting: Orthopedic Surgery

## 2021-11-03 ENCOUNTER — Other Ambulatory Visit: Payer: Self-pay

## 2021-11-03 DIAGNOSIS — M48061 Spinal stenosis, lumbar region without neurogenic claudication: Secondary | ICD-10-CM

## 2021-11-04 ENCOUNTER — Encounter: Payer: Self-pay | Admitting: Orthopedic Surgery

## 2021-11-04 NOTE — Progress Notes (Signed)
? ?Office Visit Note ?  ?Patient: Ariana Roberts           ?Date of Birth: 1935/05/06           ?MRN: [id number] ?Visit Date: 11/03/2021 ?Requested by: Marva Panda, NP ?[address] ?[place],  [place] [address] ?PCP: Marva Panda, NP ? ?Subjective: ?Chief Complaint  ?Patient presents with  ? Right Hip - Pain  ? ? ?HPI: Ariana Roberts is an 87 year old patient with right buttock and trochanteric pain.  Denies any groin pain.  She also reports low back pain.  Has a history of back problems.  Last seen by Dr. Alvester Morin 1021 where injections proved to be somewhat painful.  She felt like the single injection event was more helpful than the series of 4 injections at a time.  Has a lot of pain going from sitting to standing.  Does run down the right leg.  Denies any groin pain. ?             ?ROS: All systems reviewed are negative as they relate to the chief complaint within the history of present illness.  Patient denies  fevers or chills. ? ? ?Assessment & Plan: ?Visit Diagnoses:  ?1. Spinal stenosis of lumbar region without neurogenic claudication   ? ? ?Plan: Impression is low back pain with degenerative changes present.  Some of Dr. [place] Blas injections in the past have helped some have not.  In general she is able to get around and walk around so I think this is more facet arthritis mediated as opposed to stenosis mediated.  Would like her to follow-up with Dr. Alvester Morin for lumbar spine ESI evaluate right-sided radiculopathy. ? ?Follow-Up Instructions: No follow-ups on file.  ? ?Orders:  ?Orders Placed This Encounter  ?Procedures  ? XR HIP UNILAT W OR W/O PELVIS 2-3 VIEWS RIGHT  ? XR Lumbar Spine 2-3 Views  ? Ambulatory referral to Physical Medicine Rehab  ? ?No orders of the defined types were placed in this encounter. ? ? ? ? Procedures: ?No procedures performed ? ? ?Clinical Data: ?No additional findings. ? ?Objective: ?Vital Signs: There were no vitals taken for this visit. ? ?Physical Exam:  ? ?Constitutional:  Patient appears well-developed ?HEENT:  ?Head: Normocephalic ?Eyes:EOM are normal ?Neck: Normal range of motion ?Cardiovascular: Normal rate ?Pulmonary/chest: Effort normal ?Neurologic: Patient is alert ?Skin: Skin is warm ?Psychiatric: Patient has normal mood and affect ? ? ?Ortho Exam: Ortho exam demonstrates full active and passive range of motion of the knees ankles and hips.  No real groin pain with internal/external rotation of either hip.  No masses lymphadenopathy or skin changes noted in the hip or back region.  Mild pain with forward lateral bending.  No discrete trochanteric tenderness.  Most of her tenderness is in the sciatic notch region on the right-hand side.  No muscle atrophy in either leg. ? ?Specialty Comments:  ?No specialty comments available. ? ?Imaging: ?No results found. ? ? ?PMFS History: ?Patient Active Problem List  ? Diagnosis Date Noted  ? Acute maxillary sinusitis 03/05/2020  ? Hoarse 03/05/2020  ? Irritant dermatitis 07/25/2018  ? Seborrheic keratosis 07/25/2018  ? Laryngopharyngeal reflux (LPR) 02/28/2017  ? Perennial allergic rhinitis 02/28/2017  ? Acute medial meniscal tear 02/15/2016  ? Palpitations   ? Hyperlipidemia   ? GERD (gastroesophageal reflux disease)   ? Hypertension   ? Hypercholesteremia   ? Pain in throat 06/04/2012  ? ?Past Medical History:  ?Diagnosis Date  ? GERD (gastroesophageal reflux disease)   ?  Hyperlipidemia   ? Hypertension   ?  ?Family History  ?Problem Relation Age of Onset  ? Heart disease Other   ?     No family history  ?  ?Past Surgical History:  ?Procedure Laterality Date  ? COLONOSCOPY    ? ESOPHAGOGASTRODUODENOSCOPY    ? KNEE ARTHROSCOPY WITH MEDIAL MENISECTOMY Left 02/15/2016  ? Procedure: LEFT KNEE ARTHROSCOPY WITH MEDIAL MENISECTOMY;  Surgeon: Cammy Copa, MD;  Location: [facility] OR;  Service: Orthopedics;  Laterality: Left;  LEFT KNEE DIAGNOSTIC OPERATIVE ARHTROSCOPY, DEBRIDEMENT.  ? VAGINA SURGERY  2000  ? ?Social History  ? ?Occupational  History  ?  Employer: RETIRED  ?Tobacco Use  ? Smoking status: Never  ? Smokeless tobacco: Never  ?Substance and Sexual Activity  ? Alcohol use: No  ? Drug use: No  ? Sexual activity: Not on file  ? ? ? ? ? ?

## 2021-11-07 ENCOUNTER — Telehealth: Payer: Self-pay | Admitting: Physical Medicine and Rehabilitation

## 2021-11-07 NOTE — Telephone Encounter (Signed)
Patient's son called. Returning a call to [place] 

## 2021-11-16 ENCOUNTER — Other Ambulatory Visit: Payer: Self-pay

## 2021-11-16 ENCOUNTER — Encounter: Payer: Self-pay | Admitting: Physical Medicine and Rehabilitation

## 2021-11-16 ENCOUNTER — Ambulatory Visit (INDEPENDENT_AMBULATORY_CARE_PROVIDER_SITE_OTHER): Payer: Medicare (Managed Care) | Admitting: Physical Medicine and Rehabilitation

## 2021-11-16 DIAGNOSIS — M47816 Spondylosis without myelopathy or radiculopathy, lumbar region: Secondary | ICD-10-CM | POA: Diagnosis not present

## 2021-11-16 DIAGNOSIS — M5416 Radiculopathy, lumbar region: Secondary | ICD-10-CM

## 2021-11-16 DIAGNOSIS — M4316 Spondylolisthesis, lumbar region: Secondary | ICD-10-CM

## 2021-11-16 MED ORDER — DIAZEPAM 5 MG PO TABS: ORAL_TABLET | ORAL | 0 refills | Status: AC

## 2021-11-16 NOTE — Progress Notes (Signed)
Pt state lower back pain that travels to her right buttock, leg and calf. Pt state the pain started a month ago. Pt state sitting and then having to get up and taking her first step makes the pain worse. Pt state she doesn't take anything for the pain . ? ?Numeric Pain Rating Scale and Functional Assessment ?Average Pain 7 ?Pain Right Now 2 ?My pain is intermittent, dull, and aching ?Pain is worse with: sitting and some activites ?Pain improves with: rest ? ? ?In the last MONTH (on 0-10 scale) has pain interfered with the following? ? ?1. General activity like being  able to carry out your everyday physical activities such as walking, climbing stairs, carrying groceries, or moving a chair?  ?Rating(7) ? ?2. Relation with others like being able to carry out your usual social activities and roles such as  activities at home, at work and in your community. ?Rating(8) ? ?3. Enjoyment of life such that you have  been bothered by emotional problems such as feeling anxious, depressed or irritable?  ?Rating(9) ? ?

## 2021-11-16 NOTE — Progress Notes (Addendum)
Ariana Roberts - 87 y.o. female MRN [id number]  Date of birth: 08-06-1935  Office Visit Note: Visit Date: 11/16/2021 PCP: Marva Panda, NP Referred by: Marva Panda, NP  Subjective: Chief Complaint  Patient presents with   Lower Back - Pain   Right Leg - Pain   HPI: Ariana Roberts is a 87 y.o. female who comes in today for evaluation of chronic, worsening and severe bilateral lower back pain radiating to buttock, right greater than left.  Son at the bedside during our visit, he is serving as interpreter for his mother. Patient reports pain has been ongoing for several years.  Patient reports pain is exacerbated by walking, prolonged sitting and moving from a sitting to standing position.  She describes pain as a constant throbbing and sore sensation, currently rates as 9 out of 10.  Patient reports some relief of pain with rest and use of medications.  Patient's lumbar MRI from 2019 exhibits multilevel facet arthropathy, most severe at L4-L5, there is also grade 1 anterolisthesis at this level.  There is mild spinal canal stenosis noted at L3-L4 and L4-L5.  Patient had bilateral L4-L5 radiofrequency ablation in October of 2021 and reports significant (greater than 80%) and sustained pain relief  roughly 1 year with this procedure. Patient also reports increased functionality after procedure.  Patient states her back pain is severely debilitating and now reports she is using a cane to assist with ambulation.  Patient also reports difficulty sleeping and completing daily tasks due to severe pain.  Patient states she had significant pain with previous radiofrequency ablation and is requesting a medication to help her relax on the day of this procedure.  Incidentally, patient also mentioned chronic intermittent pain that radiates down right lateral leg to calf. Patient states this is a new pain that started approximately 1 month ago.  Patient states pain is exacerbated by walking, standing and  activity.  She describes this pain as a sharp and burning sensation, currently rates as 5 out of 10.  Patient reports some relief of pain with rest and medications.  Patient states she had a similar pain in 2020 on the left side and did receive left L4 transforaminal epidural steroid injection in our office and reports this procedure did help to alleviate her pain.  Patient states lower back pain is more of an issue for her at this time.  Patient denies focal weakness, numbness and tingling.  Patient denies recent trauma or falls.  Oswestry Disability Index Score 42% 20 to 30 (60%) severe disability: Pain remains the main problem in this group but activities of daily living are affected. These patients require a detailed investigation.  Review of Systems  Musculoskeletal:  Positive for back pain.  Neurological:  Negative for tingling, sensory change, focal weakness and weakness.  All other systems reviewed and are negative. Otherwise per HPI.  Assessment & Plan: Visit Diagnoses:    ICD-10-CM   1. Spondylosis without myelopathy or radiculopathy, lumbar region  M47.816 Ambulatory referral to Physical Medicine Rehab    2. Facet arthropathy, lumbar  M47.816 Ambulatory referral to Physical Medicine Rehab    3. Lumbar radiculopathy  M54.16     4. Spondylolisthesis of lumbar region  M43.16        Plan: Findings:  1.  Chronic, worsening and severe bilateral lower back pain, right greater than left.  Patient continues to have excruciating and debilitating pain despite good conservative therapy such as rest and use of medications.  Patient's clinical  presentation and exam are consistent with facet mediated pain, she does have severe pain when moving from a sitting to standing position, there is moderate facet arthropathy noted bilaterally at L4-L5.  We believe the next step is to repeat bilateral L4-L5 radiofrequency ablation under fluoroscopic guidance.  I did talk with patient today about her  concerns with anxiety related to this procedure, I discussed use of oral sedation to help her relax and placed a prescription for preprocedure Valium.  2.  Chronic and intermittent pain radiating down right lateral leg to calf.  Pain started approximately 1 month ago and continues to bother her despite good conservative therapy such as rest and use of medications.  Patient's clinical presentation and exam are consistent with L5 nerve pattern.  Patient did voice that her bilateral lower back pain is more concerning for her at this time and would like to address this issue first.  If her right-sided radicular symptoms persist we would consider performing a diagnostic and hopefully therapeutic right L5 transforaminal epidural steroid injection under fluoroscopic guidance.  If patient continues to have pain after epidural steroid injection we would consider obtaining new lumbar MRI imaging.  No red flag symptoms noted upon exam today.   Meds & Orders:  Meds ordered this encounter  Medications   diazepam (VALIUM) 5 MG tablet    Sig: Take one tablet by mouth with food one hour prior to procedure. May repeat 30 minutes prior if needed.    Dispense:  2 tablet    Refill:  0    Order Specific Question:   Supervising Provider    Answer:   Tyrell Antonio [[id number]]    Orders Placed This Encounter  Procedures   Ambulatory referral to Physical Medicine Rehab    Follow-up: Return for Bilateral L4-L5 radiofrequency ablation.   Procedures: No procedures performed      Clinical History: No specialty comments available.   She reports that she has never smoked. She has never used smokeless tobacco. No results for input(s): HGBA1C, LABURIC in the last 8760 hours.  Objective:  VS:  HT:    WT:   BMI:     BP:   HR: bpm  TEMP: ( )  RESP:  Physical Exam Vitals and nursing note reviewed.  HENT:     Head: Normocephalic and atraumatic.     Right Ear: External ear normal.     Left Ear: External ear  normal.     Nose: Nose normal.     Mouth/Throat:     Mouth: Mucous membranes are moist.  Eyes:     Extraocular Movements: Extraocular movements intact.  Cardiovascular:     Rate and Rhythm: Normal rate.     Pulses: Normal pulses.  Pulmonary:     Effort: Pulmonary effort is normal.  Abdominal:     General: Abdomen is flat. There is no distension.  Musculoskeletal:        General: Tenderness present.     Cervical back: Normal range of motion.     Comments: Pt is slow to rise from seated position to standing. Concordant low back pain with facet loading, lumbar spine extension and rotation. Strong distal strength without clonus, no pain upon palpation of greater trochanters. Sensation intact bilaterally. Ambulates with cane, gait unsteady.    Skin:    General: Skin is warm and dry.     Capillary Refill: Capillary refill takes less than 2 seconds.  Neurological:     Mental Status: She is alert and  oriented to person, place, and time.     Gait: Gait abnormal.  Psychiatric:        Mood and Affect: Mood normal.        Behavior: Behavior normal.    Ortho Exam  Imaging: No results found.  Past Medical/Family/Surgical/Social History: Medications & Allergies reviewed per EMR, new medications updated. Patient Active Problem List   Diagnosis Date Noted   Acute maxillary sinusitis 03/05/2020   Hoarse 03/05/2020   Irritant dermatitis 07/25/2018   Seborrheic keratosis 07/25/2018   Laryngopharyngeal reflux (LPR) 02/28/2017   Perennial allergic rhinitis 02/28/2017   Acute medial meniscal tear 02/15/2016   Palpitations    Hyperlipidemia    GERD (gastroesophageal reflux disease)    Hypertension    Hypercholesteremia    Pain in throat 06/04/2012   Past Medical History:  Diagnosis Date   GERD (gastroesophageal reflux disease)    Hyperlipidemia    Hypertension    Family History  Problem Relation Age of Onset   Heart disease Other        No family history   Past Surgical History:   Procedure Laterality Date   COLONOSCOPY     ESOPHAGOGASTRODUODENOSCOPY     KNEE ARTHROSCOPY WITH MEDIAL MENISECTOMY Left 02/15/2016   Procedure: LEFT KNEE ARTHROSCOPY WITH MEDIAL MENISECTOMY;  Surgeon: Cammy Copa, MD;  Location: [facility] OR;  Service: Orthopedics;  Laterality: Left;  LEFT KNEE DIAGNOSTIC OPERATIVE ARHTROSCOPY, DEBRIDEMENT.   VAGINA SURGERY  2000   Social History   Occupational History    Employer: RETIRED  Tobacco Use   Smoking status: Never   Smokeless tobacco: Never  Substance and Sexual Activity   Alcohol use: No   Drug use: No   Sexual activity: Not on file

## 2021-12-08 ENCOUNTER — Telehealth: Payer: Self-pay | Admitting: Physical Medicine and Rehabilitation

## 2021-12-08 NOTE — Telephone Encounter (Signed)
Pt's son Delton See called about a referral for his mom/pt. Please call him to set appt for pt at [phone] ?

## 2021-12-12 ENCOUNTER — Telehealth: Payer: Self-pay | Admitting: Physical Medicine and Rehabilitation

## 2021-12-12 NOTE — Telephone Encounter (Signed)
Pt's son Delton See called requesting a call back to set pt's appt for referral. Please call pt at [phone] ?

## 2022-01-12 ENCOUNTER — Ambulatory Visit (INDEPENDENT_AMBULATORY_CARE_PROVIDER_SITE_OTHER): Payer: Medicare (Managed Care) | Admitting: Physical Medicine and Rehabilitation

## 2022-01-12 ENCOUNTER — Encounter: Payer: Self-pay | Admitting: Physical Medicine and Rehabilitation

## 2022-01-12 ENCOUNTER — Ambulatory Visit: Payer: Self-pay

## 2022-01-12 VITALS — BP 138/64 | HR 72

## 2022-01-12 DIAGNOSIS — M47816 Spondylosis without myelopathy or radiculopathy, lumbar region: Secondary | ICD-10-CM | POA: Diagnosis not present

## 2022-01-12 MED ORDER — METHYLPREDNISOLONE ACETATE 80 MG/ML IJ SUSP
80.0000 mg | Freq: Once | INTRAMUSCULAR | Status: AC
  Administered 2022-01-12: 80 mg

## 2022-01-12 NOTE — Patient Instructions (Signed)

## 2022-01-12 NOTE — Progress Notes (Signed)
Numeric Pain Rating Scale and Functional Assessment Average Pain 0   In the last MONTH (on 0-10 scale) has pain interfered with the following?  1. General activity like being  able to carry out your everyday physical activities such as walking, climbing stairs, carrying groceries, or moving a chair?  Rating(7)  Patient is here for Bilateral L4-L5 radiofrequency ablation. Patient is not taking anything for pain. Worse with movement. Uses cane to ambulate.      +Driver, -BT, -Dye Allergies.

## 2022-01-29 NOTE — Procedures (Signed)
Lumbar Facet Joint Nerve Denervation  Patient: Ariana Roberts      Date of Birth: 30-Jul-1935 MRN: [id number] PCP: Marva Panda, NP      Visit Date: 01/12/2022   Universal Protocol:    Date/Time: 05/28/233:04 PM  Consent Given By: the patient  Position: PRONE  Additional Comments: Vital signs were monitored before and after the procedure. Patient was prepped and draped in the usual sterile fashion. The correct patient, procedure, and site was verified.   Injection Procedure Details:   Procedure diagnoses:  1. Facet arthropathy, lumbar      Meds Administered:  Meds ordered this encounter  Medications   methylPREDNISolone acetate (DEPO-MEDROL) injection 80 mg     Laterality: Bilateral  Location/Site:  L4-L5, L3 and L4 medial branches  Needle: 18 ga.,  28mm active tip, RF Cannula  Needle Placement: Along juncture of superior articular process and transverse pocess  Findings:  -Comments:  Procedure Details: For each desired target nerve, the corresponding transverse process (sacral ala for the L5 dorsal rami) was identified and the fluoroscope was positioned to square off the endplates of the corresponding vertebral body to achieve a true AP midline view.  The beam was then obliqued 15 to 20 degrees and caudally tilted 15 to 20 degrees to line up a trajectory along the target nerves. The skin over the target of the junction of superior articulating process and transverse process (sacral ala for the L5 dorsal rami) was infiltrated with 13ml of 1% Lidocaine without Epinephrine.  The 18 gauge 67mm active tip outer cannula was advanced in trajectory view to the target.  This procedure was repeated for each target nerve.  Then, for all levels, the outer cannula placement was fine-tuned and the position was then confirmed with bi-planar imaging.    Test stimulation was done both at sensory and motor levels to ensure there was no radicular stimulation. The target tissues  were then infiltrated with 1 ml of 1% Lidocaine without Epinephrine. Subsequently, a percutaneous neurotomy was carried out for 90 seconds at 80 degrees Celsius.  After the completion of the lesion, 1 ml of injectate was delivered. It was then repeated for each facet joint nerve mentioned above. Appropriate radiographs were obtained to verify the probe placement during the neurotomy.   Additional Comments:  No complications occurred Dressing: 2 x 2 sterile gauze and Band-Aid    Post-procedure details: Patient was observed during the procedure. Post-procedure instructions were reviewed.  Patient left the clinic in stable condition.

## 2022-01-29 NOTE — Progress Notes (Signed)
Ariana Roberts - 87 y.o. female MRN [id number]  Date of birth: Aug 15, 1935  Office Visit Note: Visit Date: 01/12/2022 PCP: Everardo Beals, NP Referred by: Everardo Beals, NP  Subjective: Chief Complaint  Patient presents with   Lower Back - Pain   HPI:  Ariana Roberts is a 87 y.o. female who comes in todayfor planned repeat radiofrequency ablation of the Bilateral L4-5 and L4-L5  Lumbar facet joints. This would be ablation of the corresponding medial branches and/or dorsal rami.  Patient has had double diagnostic blocks with more than 70% relief.  Subsequent ablation gave them more than 6 months of over 60% relief.  They have had chronic back pain for quite some time, more than 3 months, which has been an ongoing situation with recalcitrant axial back pain.  They have no radicular pain.  Their axial pain is worse with standing and ambulating and on exam today with facet loading.  They have had physical therapy as well as home exercise program.  The imaging noted in the chart below indicated facet pathology. Accordingly they meet all the criteria and qualification for for radiofrequency ablation and we are going to complete this today hopefully for more longer term relief as part of comprehensive management program.   ROS Otherwise per HPI.  Assessment & Plan: Visit Diagnoses:    ICD-10-CM   1. Facet arthropathy, lumbar  M47.816 XR C-ARM NO REPORT    Radiofrequency,Lumbar    methylPREDNISolone acetate (DEPO-MEDROL) injection 80 mg      Plan: No additional findings.   Meds & Orders:  Meds ordered this encounter  Medications   methylPREDNISolone acetate (DEPO-MEDROL) injection 80 mg    Orders Placed This Encounter  Procedures   Radiofrequency,Lumbar   XR C-ARM NO REPORT    Follow-up: Return if symptoms worsen or fail to improve.   Procedures: No procedures performed  Lumbar Facet Joint Nerve Denervation  Patient: Ariana Roberts      Date of Birth: 1935/02/14 MRN: [id number] PCP:  Everardo Beals, NP      Visit Date: 01/12/2022   Universal Protocol:    Date/Time: 05/28/233:04 PM  Consent Given By: the patient  Position: PRONE  Additional Comments: Vital signs were monitored before and after the procedure. Patient was prepped and draped in the usual sterile fashion. The correct patient, procedure, and site was verified.   Injection Procedure Details:   Procedure diagnoses:  1. Facet arthropathy, lumbar      Meds Administered:  Meds ordered this encounter  Medications   methylPREDNISolone acetate (DEPO-MEDROL) injection 80 mg     Laterality: Bilateral  Location/Site:  L4-L5, L3 and L4 medial branches  Needle: 18 ga.,  32mm active tip, 144mm RF Cannula  Needle Placement: Along juncture of superior articular process and transverse pocess  Findings:  -Comments:  Procedure Details: For each desired target nerve, the corresponding transverse process (sacral ala for the L5 dorsal rami) was identified and the fluoroscope was positioned to square off the endplates of the corresponding vertebral body to achieve a true AP midline view.  The beam was then obliqued 15 to 20 degrees and caudally tilted 15 to 20 degrees to line up a trajectory along the target nerves. The skin over the target of the junction of superior articulating process and transverse process (sacral ala for the L5 dorsal rami) was infiltrated with 10ml of 1% Lidocaine without Epinephrine.  The 18 gauge 66mm active tip outer cannula was advanced in trajectory view to the target.  This  procedure was repeated for each target nerve.  Then, for all levels, the outer cannula placement was fine-tuned and the position was then confirmed with bi-planar imaging.    Test stimulation was done both at sensory and motor levels to ensure there was no radicular stimulation. The target tissues were then infiltrated with 1 ml of 1% Lidocaine without Epinephrine. Subsequently, a percutaneous neurotomy was  carried out for 90 seconds at 80 degrees Celsius.  After the completion of the lesion, 1 ml of injectate was delivered. It was then repeated for each facet joint nerve mentioned above. Appropriate radiographs were obtained to verify the probe placement during the neurotomy.   Additional Comments:  No complications occurred Dressing: 2 x 2 sterile gauze and Band-Aid    Post-procedure details: Patient was observed during the procedure. Post-procedure instructions were reviewed.  Patient left the clinic in stable condition.      Clinical History: MRI LUMBAR SPINE WITHOUT CONTRAST     TECHNIQUE:  Multiplanar, multisequence MR imaging of the lumbar spine was  performed. No intravenous contrast was administered.     COMPARISON:  Lumbar spine radiographs June 06, 2017     FINDINGS:  SEGMENTATION: For the purposes of this report, the last well-formed  intervertebral disc is reported as L5-S1.     ALIGNMENT: Maintained lumbar lordosis. Grade 1 L4-5 anterolisthesis  without spondylolysis. Minimal grade 1 L2-3 retrolisthesis.     VERTEBRAE:Vertebral bodies are intact. Intervertebral discs  demonstrate normal morphology and signal characteristics. No  abnormal bone marrow signal.     CONUS MEDULLARIS AND CAUDA EQUINA: Conus medullaris terminates at  L1-2 and demonstrates normal morphology and signal characteristics.  8 mm low signal intracanalicular nodule along the cauda equina at  the level of S2.     PARASPINAL AND OTHER SOFT TISSUES: Nonacute.     DISC LEVELS:     T12-L1: Small broad-based disc bulge asymmetric to LEFT. No canal  stenosis. Moderate LEFT neural foraminal narrowing.     L1-2: Small broad-based disc bulge asymmetric to the LEFT. No canal  stenosis. Mild LEFT neural foraminal narrowing.     L2-3: Retrolisthesis. Moderate broad-based disc bulge. No canal  stenosis. Mild bilateral neural foraminal narrowing.     L3-4: Small broad-based disc bulge. Minimal  facet arthropathy and  ligamentum flavum redundancy. Mild canal stenosis. Moderate RIGHT  and mild LEFT neural foraminal narrowing.     L4-5: Anterolisthesis. Small broad-based disc bulge with unroofing  of the disc. Moderate to severe RIGHT and mild LEFT facet  arthropathy and ligamentum flavum redundancy. Mild canal stenosis.  Moderate to severe RIGHT and mild LEFT neural foraminal narrowing.     L5-S1: Small broad-based disc bulge. Minimal facet arthropathy  without canal stenosis. Moderate RIGHT neural foraminal narrowing.     IMPRESSION:  1. No fracture. Grade 1 L4-5 anterolisthesis without spondylolysis.  Minimal grade 1 L2-3 retrolisthesis.  2. Mild canal stenosis L3-4 and L4-5.  3. Neural foraminal narrowing at all lumbar levels: Moderate to  severe on the RIGHT at L4-5.  4. 8 mm nodule along cauda equina at the level sacrum, probable  schwannoma which could be confirmed with contrast-enhanced MRI.        Electronically Signed    By: Elon Alas M.D.    On: 06/03/2018 03:16     Objective:  VS:  HT:    WT:   BMI:     BP:138/64  HR:72bpm  TEMP: ( )  RESP:  Physical Exam Vitals  and nursing note reviewed.  Constitutional:      General: She is not in acute distress.    Appearance: Normal appearance. She is not ill-appearing.  HENT:     Head: Normocephalic and atraumatic.     Right Ear: External ear normal.     Left Ear: External ear normal.  Eyes:     Extraocular Movements: Extraocular movements intact.  Cardiovascular:     Rate and Rhythm: Normal rate.     Pulses: Normal pulses.  Pulmonary:     Effort: Pulmonary effort is normal. No respiratory distress.  Abdominal:     General: There is no distension.     Palpations: Abdomen is soft.  Musculoskeletal:        General: Tenderness present.     Cervical back: Neck supple.     Right lower leg: No edema.     Left lower leg: No edema.     Comments: Patient has good distal strength with no pain over the  greater trochanters.  No clonus or focal weakness.  Skin:    Findings: No erythema, lesion or rash.  Neurological:     General: No focal deficit present.     Mental Status: She is alert and oriented to person, place, and time.     Sensory: No sensory deficit.     Motor: No weakness or abnormal muscle tone.     Coordination: Coordination normal.  Psychiatric:        Mood and Affect: Mood normal.        Behavior: Behavior normal.     Imaging: No results found.

## 2023-06-14 ENCOUNTER — Ambulatory Visit: Payer: Medicare (Managed Care) | Admitting: Physical Therapy

## 2023-10-06 ENCOUNTER — Emergency Department (HOSPITAL_COMMUNITY): Payer: 59

## 2023-10-06 ENCOUNTER — Emergency Department (HOSPITAL_COMMUNITY)
Admission: EM | Admit: 2023-10-06 | Discharge: 2023-10-06 | Disposition: A | Discharge status: Home / Self Care | Payer: 59 | Attending: Emergency Medicine | Admitting: Emergency Medicine

## 2023-10-06 ENCOUNTER — Encounter (HOSPITAL_COMMUNITY): Payer: Self-pay

## 2023-10-06 ENCOUNTER — Other Ambulatory Visit: Payer: Self-pay

## 2023-10-06 DIAGNOSIS — R059 Cough, unspecified: Secondary | ICD-10-CM | POA: Diagnosis present

## 2023-10-06 DIAGNOSIS — Z20822 Contact with and (suspected) exposure to covid-19: Secondary | ICD-10-CM | POA: Insufficient documentation

## 2023-10-06 DIAGNOSIS — Z79899 Other long term (current) drug therapy: Secondary | ICD-10-CM | POA: Insufficient documentation

## 2023-10-06 DIAGNOSIS — J101 Influenza due to other identified influenza virus with other respiratory manifestations: Secondary | ICD-10-CM

## 2023-10-06 DIAGNOSIS — J09X2 Influenza due to identified novel influenza A virus with other respiratory manifestations: Secondary | ICD-10-CM | POA: Insufficient documentation

## 2023-10-06 LAB — CBC
HCT: 40.7 % (ref 36.0–46.0)
Hemoglobin: 13.6 g/dL (ref 12.0–15.0)
MCH: 27.9 pg (ref 26.0–34.0)
MCHC: 33.4 g/dL (ref 30.0–36.0)
MCV: 83.4 fL (ref 80.0–100.0)
Platelets: 207 10*3/uL (ref 150–400)
RBC: 4.88 MIL/uL (ref 3.87–5.11)
RDW: 13 % (ref 11.5–15.5)
WBC: 5.4 10*3/uL (ref 4.0–10.5)
nRBC: 0 % (ref 0.0–0.2)

## 2023-10-06 LAB — BASIC METABOLIC PANEL
Anion gap: 10 (ref 5–15)
BUN: 13 mg/dL (ref 8–23)
CO2: 21 mmol/L — ABNORMAL LOW (ref 22–32)
Calcium: 8.6 mg/dL — ABNORMAL LOW (ref 8.9–10.3)
Chloride: 102 mmol/L (ref 98–111)
Creatinine, Ser: 0.99 mg/dL (ref 0.44–1.00)
GFR, Estimated: 56 mL/min — ABNORMAL LOW (ref >60)
Glucose, Bld: 103 mg/dL — ABNORMAL HIGH (ref 70–99)
Potassium: 3.7 mmol/L (ref 3.5–5.1)
Sodium: 133 mmol/L — ABNORMAL LOW (ref 135–145)

## 2023-10-06 LAB — RESP PANEL BY RT-PCR (RSV, FLU A&B, COVID)  RVPGX2
Influenza A by PCR: POSITIVE — AB
Influenza B by PCR: NEGATIVE
Resp Syncytial Virus by PCR: NEGATIVE
SARS Coronavirus 2 by RT PCR: NEGATIVE

## 2023-10-06 LAB — GROUP A STREP BY PCR: Group A Strep by PCR: NOT DETECTED

## 2023-10-06 NOTE — ED Triage Notes (Signed)
Spanish interpreter used for triage:   Pt c.o productive cough, sore throat since last night no OTC meds for s/s. Pt states she has been feeling bad intermittently since December, was seen at her PCP recently and given a shot but pt unsure what it was.

## 2023-10-06 NOTE — ED Provider Notes (Signed)
[place] EMERGENCY DEPARTMENT AT [facility] Provider Note   CSN: [id number] Arrival date & time: 10/06/23  4098     History  Chief Complaint  Patient presents with   Cough   Sore Throat    Nyimah Shadduck is a 89 y.o. female.  89 year old female presents with 2 days of URI symptoms.  Has had cough congestion.  No vomiting or diarrhea.  Some myalgias.  Not eating or drinking as much.  Denies any dyspnea.  No treatment use prior to arrival       Home Medications Prior to Admission medications   Medication Sig Start Date End Date Taking? Authorizing Provider  amLODIPine Besylate-Celecoxib 5-200 MG TABS Take by mouth.    [provider]  baclofen (LIORESAL) 10 MG tablet Take 1 tablet (10 mg total) by mouth 2 (two) times daily. 06/06/17   Cammy Copa, MD  cyclobenzaprine (FLEXERIL) 5 MG tablet Take 5 mg by mouth at bedtime. 10/16/19   [provider]  diazepam (VALIUM) 5 MG tablet Take one tablet by mouth with food one hour prior to procedure. May repeat 30 minutes prior if needed. 11/16/21   Juanda Chance, NP  diclofenac sodium (VOLTAREN) 1 % GEL Apply 4 g topically 4 (four) times daily. 10/25/17   Hyatt, Max T, DPM  diphenhydrAMINE (BENADRYL) 25 MG tablet Take 25 mg by mouth every 6 (six) hours as needed for allergies.    [provider]  ezetimibe (ZETIA) 10 MG tablet Take 10 mg by mouth daily.    [provider]  hydrocortisone (ANUSOL-HC) 25 MG suppository hydrocortisone acetate 25 mg rectal suppository  INSERT 1 SUPPOSITORY RECTALLY TWICE A DAY AS NEEDED FOR 15 DAYS.    [provider]  lactulose (CHRONULAC) 10 GM/15ML solution TAKE 15 ML EVERY DAY BY ORAL ROUTE AS NEEDED FOR 30 DAYS. 09/19/17   [provider]  lidocaine (XYLOCAINE) 2 % solution GARGLE AND SPIT EVERY 4 HOURS 04/26/17   [provider]  losartan-hydrochlorothiazide (HYZAAR) 100-25 MG tablet Take 1 tablet by mouth daily.    [provider]  lovastatin (MEVACOR) 20 MG tablet TOME UNA TABLETA TODOS LOS D?AS 04/26/17   [provider]  meloxicam (MOBIC) 7.5 MG tablet TAKE 1 TABLET BY MOUTH EVERY OTHER DAY FOR 2 MONTHS 03/16/17   Cammy Copa, MD  mometasone (NASONEX) 50 MCG/ACT nasal spray SPRAY 2 SPRAYS INTO EACH NOSTRIL TODOS LOS D?AS 02/28/17   [provider]  omeprazole (PRILOSEC) 40 MG capsule Take 40 mg by mouth daily.  12/19/13   [provider]  pantoprazole (PROTONIX) 40 MG tablet TAKE 1 TABLET BY MOUTH 30 MINUTES BEFORE BREAKFAST AND 1 TABLET 30 MINUTES BEFORE DINNER 02/28/17   [provider]  sucralfate (CARAFATE) 1 g tablet Take 1 g by mouth 4 (four) times daily. 02/03/20   [provider]      Allergies    Penicillins    Review of Systems   Review of Systems  All other systems reviewed and are negative.   Physical Exam Updated Vital Signs BP (!) 168/54 (BP Location: Right Arm)   Pulse 87   Temp 98.4 F (36.9 C)   Resp 17   SpO2 95%  Physical Exam Vitals and nursing note reviewed.  Constitutional:      General: She is not in acute distress.    Appearance: Normal appearance. She is well-developed. She is not toxic-appearing.  HENT:     Head:  Normocephalic and atraumatic.  Eyes:     General: Lids are normal.     Conjunctiva/sclera: Conjunctivae normal.     Pupils: Pupils are equal, round, and reactive to light.  Neck:     Thyroid: No thyroid mass.     Trachea: No tracheal deviation.  Cardiovascular:     Rate and Rhythm: Normal rate and regular rhythm.     Heart sounds: Normal heart sounds. No murmur heard.    No gallop.  Pulmonary:     Effort: Pulmonary effort is normal. No respiratory distress.     Breath sounds: Normal breath sounds. No stridor. No decreased breath sounds, wheezing, rhonchi or rales.  Abdominal:     General: There is no distension.     Palpations: Abdomen is soft.     Tenderness: There is no abdominal tenderness.  There is no rebound.  Musculoskeletal:        General: No tenderness. Normal range of motion.     Cervical back: Normal range of motion and neck supple.  Skin:    General: Skin is warm and dry.     Findings: No abrasion or rash.  Neurological:     Mental Status: She is alert and oriented to person, place, and time. Mental status is at baseline.     GCS: GCS eye subscore is 4. GCS verbal subscore is 5. GCS motor subscore is 6.     Cranial Nerves: No cranial nerve deficit.     Sensory: No sensory deficit.     Motor: Motor function is intact.  Psychiatric:        Attention and Perception: Attention normal.        Speech: Speech normal.        Behavior: Behavior normal.     ED Results / Procedures / Treatments   Labs (all labs ordered are listed, but only abnormal results are displayed) Labs Reviewed  RESP PANEL BY RT-PCR (RSV, FLU A&B, COVID)  RVPGX2 - Abnormal; Notable for the following components:      Result Value   Influenza A by PCR POSITIVE (*)    All other components within normal limits  BASIC METABOLIC PANEL - Abnormal; Notable for the following components:   Sodium 133 (*)    CO2 21 (*)    Glucose, Bld 103 (*)    Calcium 8.6 (*)    GFR, Estimated 56 (*)    All other components within normal limits  GROUP A STREP BY PCR  CBC    EKG None  Radiology DG Chest 2 View Result Date: 10/06/2023 CLINICAL DATA:  Productive cough. EXAM: CHEST - 2 VIEW COMPARISON:  None Available. FINDINGS: The heart size and mediastinal contours are within normal limits. Both lungs are clear. The visualized skeletal structures are unremarkable. IMPRESSION: No active cardiopulmonary disease. Electronically Signed   By: Lupita Raider M.D.   On: 10/06/2023 12:57    Procedures Procedures    Medications Ordered in ED Medications - No data to display  ED Course/ Medical Decision Making/ A&P                                 Medical Decision Making  Patient's chest x-ray per my  interpretation shows no acute findings.  She is flu a positive here.  Labs otherwise unremarkable.  She is nontoxic-appearing and will discharge home.        Final Clinical Impression(s) /  ED Diagnoses Final diagnoses:  None    Rx / DC Orders ED Discharge Orders     None         Lorre Nick, MD 10/06/23 1342

## 2023-10-06 NOTE — ED Provider Triage Note (Signed)
Emergency Medicine Provider Triage Evaluation Note  Ariana Roberts , a 89 y.o. female  was evaluated in triage.  Pt complains of cough, congestion. States same began yesterday. Also notes bodyaches, specifically in her legs. Notes decreased appetite, has not been eating or drinking much in the past few days. No fevers, chest pain, or shortness of breath  Review of Systems  Positive:  Negative:   Physical Exam  BP (!) 168/54 (BP Location: Right Arm)   Pulse 87   Temp 98.4 F (36.9 C)   Resp 17   SpO2 95%  Gen:   Awake, no distress   Resp:  Normal effort  MSK:   Moves extremities without difficulty  Other:    Medical Decision Making  Medically screening exam initiated at 11:22 AM.  Appropriate orders placed.  Ariana Roberts was informed that the remainder of the evaluation will be completed by another provider, this initial triage assessment does not replace that evaluation, and the importance of remaining in the ED until their evaluation is complete.  Spanish speaking requiring interpreter   Vear Clock 10/06/23 1124

## 2024-02-02 ENCOUNTER — Ambulatory Visit
Admission: EM | Admit: 2024-02-02 | Discharge: 2024-02-02 | Disposition: A | Discharge status: Home / Self Care | Attending: Family Medicine | Admitting: Family Medicine

## 2024-02-02 ENCOUNTER — Other Ambulatory Visit: Payer: Self-pay

## 2024-02-02 DIAGNOSIS — H00015 Hordeolum externum left lower eyelid: Secondary | ICD-10-CM

## 2024-02-02 MED ORDER — ERYTHROMYCIN 5 MG/GM OP OINT: TOPICAL_OINTMENT | OPHTHALMIC | 0 refills | Status: AC

## 2024-02-02 NOTE — ED Triage Notes (Signed)
 Pt has a red raised bump on lower eye lid of left eyex1wk. Pt c/o eye itching. Pt's son states he has been using "pink eye drops" on the eye and it has helped a little.

## 2024-02-02 NOTE — ED Provider Notes (Signed)
 [facility]    CSN: [id number] Arrival date & time: 02/02/24  4098      History   Chief Complaint No chief complaint on file.   HPI Ariana Roberts is a 89 y.o. female presents for eyelid swelling.  Patient is companied by son who translates as patient speaks [place].  Patient reports 1 week of a bump to her left lower eyelid with some drainage.  Also endorses some itching.  Denies swelling, visual changes, eye pain, injury.  Wears glasses.  Has been using over-the-counter pinkeye drops with some improvement.  No other concerns at this time.  HPI  Past Medical History:  Diagnosis Date   GERD (gastroesophageal reflux disease)    Hyperlipidemia    Hypertension     Patient Active Problem List   Diagnosis Date Noted   Acute maxillary sinusitis 03/05/2020   Hoarse 03/05/2020   Irritant dermatitis 07/25/2018   Seborrheic keratosis 07/25/2018   Laryngopharyngeal reflux (LPR) 02/28/2017   Perennial allergic rhinitis 02/28/2017   Acute medial meniscal tear 02/15/2016   Palpitations    Hyperlipidemia    GERD (gastroesophageal reflux disease)    Hypertension    Hypercholesteremia    Pain in throat 06/04/2012    Past Surgical History:  Procedure Laterality Date   COLONOSCOPY     ESOPHAGOGASTRODUODENOSCOPY     KNEE ARTHROSCOPY WITH MEDIAL MENISECTOMY Left 02/15/2016   Procedure: LEFT KNEE ARTHROSCOPY WITH MEDIAL MENISECTOMY;  Surgeon: Jasmine Mesi, MD;  Location: [facility] OR;  Service: Orthopedics;  Laterality: Left;  LEFT KNEE DIAGNOSTIC OPERATIVE ARHTROSCOPY, DEBRIDEMENT.   VAGINA SURGERY  2000    OB History   No obstetric history on file.      Home Medications    Prior to Admission medications   Medication Sig Start Date End Date Taking? Authorizing Provider  erythromycin ophthalmic ointment Place a 1/2 inch ribbon of ointment onto the left lower eyelid three times a day for 7 days 02/02/24  Yes Nahshon Reich, Jodi R, NP  amLODIPine Besylate-Celecoxib  5-200 MG TABS  Take by mouth.    [provider]  baclofen  (LIORESAL ) 10 MG tablet Take 1 tablet (10 mg total) by mouth 2 (two) times daily. 06/06/17   Jasmine Mesi, MD  cyclobenzaprine (FLEXERIL) 5 MG tablet Take 5 mg by mouth at bedtime. 10/16/19   [provider]  diazepam  (VALIUM ) 5 MG tablet Take one tablet by mouth with food one hour prior to procedure. May repeat 30 minutes prior if needed. 11/16/21   Williams, Megan E, NP  diclofenac  sodium (VOLTAREN ) 1 % GEL Apply 4 g topically 4 (four) times daily. 10/25/17   Hyatt, Max T, DPM  diphenhydrAMINE  (BENADRYL ) 25 MG tablet Take 25 mg by mouth every 6 (six) hours as needed for allergies.    [provider]  ezetimibe  (ZETIA ) 10 MG tablet Take 10 mg by mouth daily.    [provider]  hydrocortisone (ANUSOL-HC) 25 MG suppository hydrocortisone acetate 25 mg rectal suppository  INSERT 1 SUPPOSITORY RECTALLY TWICE A DAY AS NEEDED FOR 15 DAYS.    [provider]  lactulose (CHRONULAC) 10 GM/15ML solution TAKE 15 ML EVERY DAY BY ORAL ROUTE AS NEEDED FOR 30 DAYS. 09/19/17   [provider]  lidocaine  (XYLOCAINE ) 2 % solution GARGLE AND SPIT EVERY 4 HOURS 04/26/17   [provider]  losartan -hydrochlorothiazide  (HYZAAR) 100-25 MG tablet Take 1 tablet by mouth daily.    [provider]  lovastatin (MEVACOR) 20 MG tablet TOME  UNA TABLETA TODOS LOS D?AS 04/26/17   [provider]  meloxicam  (MOBIC ) 7.5 MG tablet TAKE 1 TABLET BY MOUTH EVERY OTHER DAY FOR 2 MONTHS 03/16/17   Jasmine Mesi, MD  mometasone (NASONEX) 50 MCG/ACT nasal spray SPRAY 2 SPRAYS INTO EACH NOSTRIL TODOS LOS D?AS 02/28/17   [provider]  omeprazole (PRILOSEC) 40 MG capsule Take 40 mg by mouth daily.  12/19/13   [provider]  pantoprazole  (PROTONIX ) 40 MG tablet TAKE 1 TABLET BY MOUTH 30 MINUTES BEFORE BREAKFAST AND 1 TABLET 30 MINUTES BEFORE DINNER 02/28/17   [provider]   sucralfate (CARAFATE) 1 g tablet Take 1 g by mouth 4 (four) times daily. 02/03/20   [provider]    Family History Family History  Problem Relation Age of Onset   Heart disease Other        No family history    Social History Social History   Tobacco Use   Smoking status: Never   Smokeless tobacco: Never  Substance Use Topics   Alcohol use: No   Drug use: No     Allergies   Penicillins   Review of Systems Review of Systems  Eyes:        Eyelid bump     Physical Exam Triage Vital Signs ED Triage Vitals  Encounter Vitals Group     BP 02/02/24 1010 (!) 139/59     Systolic BP Percentile --      Diastolic BP Percentile --      Pulse Rate 02/02/24 1010 76     Resp 02/02/24 1010 17     Temp 02/02/24 1010 98.2 F (36.8 C)     Temp Source 02/02/24 1010 Oral     SpO2 02/02/24 1010 95 %     Weight --      Height --      Head Circumference --      Peak Flow --      Pain Score 02/02/24 1006 0     Pain Loc --      Pain Education --      Exclude from Growth Chart --    No data found.  Updated Vital Signs BP (!) 139/59   Pulse 76   Temp 98.2 F (36.8 C) (Oral)   Resp 17   SpO2 95%   Visual Acuity Right Eye Distance:   Left Eye Distance:   Bilateral Distance:    Right Eye Near:   Left Eye Near:    Bilateral Near:     Physical Exam Vitals and nursing note reviewed.  Constitutional:      General: She is not in acute distress.    Appearance: Normal appearance. She is not ill-appearing.  HENT:     Head: Normocephalic and atraumatic.  Eyes:     General:        Left eye: Hordeolum present.No foreign body or discharge.     Conjunctiva/sclera:     Left eye: Left conjunctiva is not injected. No chemosis, exudate or hemorrhage.    Pupils: Pupils are equal, round, and reactive to light.   Cardiovascular:     Rate and Rhythm: Normal rate.  Pulmonary:     Effort: Pulmonary effort is normal.  Skin:    General: Skin is warm and dry.   Neurological:     General: No focal deficit present.     Mental Status: She is alert and oriented to person, place, and time.  Psychiatric:  Mood and Affect: Mood normal.        Behavior: Behavior normal.      UC Treatments / Results  Labs (all labs ordered are listed, but only abnormal results are displayed) Labs Reviewed - No data to display  EKG   Radiology No results found.  Procedures Procedures (including critical care time)  Medications Ordered in UC Medications - No data to display  Initial Impression / Assessment and Plan / UC Course  I have reviewed the triage vital signs and the nursing notes.  Pertinent labs & imaging results that were available during my care of the patient were reviewed by me and considered in my medical decision making (see chart for details).     Reviewed exam and symptoms with patient and son.  No red flags.  Will start erythromycin ointment as prescribed.  Advised warm compresses frequently.  Advised PCP or ophthalmology follow-up if symptoms do not improve.  ER precautions reviewed. Final Clinical Impressions(s) / UC Diagnoses   Final diagnoses:  Hordeolum externum of left lower eyelid     Discharge Instructions      Start erythromycin ointment 3 times a day.  Warm compresses frequently.  Follow-up with your PCP or ophthalmologist if symptoms do not improve.  Please go to the ER for any worsening symptoms.  Hope you feel better soon!  ED Prescriptions     Medication Sig Dispense Auth. Provider   erythromycin ophthalmic ointment Place a 1/2 inch ribbon of ointment onto the left lower eyelid three times a day for 7 days 3.5 g Travas Schexnayder, Jodi R, NP      PDMP not reviewed this encounter.   Alleen Arbour, NP 02/02/24 1045

## 2024-02-02 NOTE — Discharge Instructions (Addendum)
 Start erythromycin ointment 3 times a day.  Warm compresses frequently.  Follow-up with your PCP or ophthalmologist if symptoms do not improve.  Please go to the ER for any worsening symptoms.  Hope you feel better soon!

## 2024-07-25 ENCOUNTER — Ambulatory Visit: Admitting: Podiatry

## 2024-07-29 ENCOUNTER — Ambulatory Visit (INDEPENDENT_AMBULATORY_CARE_PROVIDER_SITE_OTHER): Admitting: Podiatry

## 2024-07-29 DIAGNOSIS — G5782 Other specified mononeuropathies of left lower limb: Secondary | ICD-10-CM

## 2024-07-29 MED ORDER — TRIAMCINOLONE ACETONIDE 40 MG/ML IJ SUSP
40.0000 mg | Freq: Once | INTRAMUSCULAR | Status: AC
  Administered 2024-07-29: 40 mg

## 2024-07-29 NOTE — Progress Notes (Signed)
 Patient presents complaining of pain on the plantar aspect of the forefoot on the left.  Began several weeks ago.  Sometimes it is a sharp shooting sensation.  Only hurts when she steps on it.  Hurts more in her bare feet.  Noticed any redness swelling or ecchymosis.  No history of any injury.   Physical exam:  General appearance: Pleasant, and in no acute distress. AOx3.  Vascular: Pedal pulses: DP 2 to/4 bilaterally, PT 2 to/4 bilaterally.  Mild edema lower legs bilaterally. Capillary fill time immediate bilaterally.  Neurological: Light touch intact feet bilaterally.  Normal Achilles reflex bilaterally.  No clonus or spasticity noted.  Positive Mulder sign third IMS left  Dermatologic:   Skin normal temperature bilaterally.  Skin normal color, tone, and texture bilaterally.   Musculoskeletal: Tenderness third IMS left.  No tenderness with range of motion lesser MTP pes.  Some tenderness plantar lateral aspect third MTP left  Diagnosis: 1.  Pain left foot 2.  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  Modifier 25. - Discussed neuroma etiology and treatment.  Discussed proper shoes. -injected 3cc 2:1 mixture 0.5 cc Marcaine : Triamcinolone  40mg /15ml at neuroma third plantar common digital nerve left.  -Dispensed OTC insoles bilaterally  Return 2 weeks follow-up injection neuroma left

## 2024-07-29 NOTE — Patient Instructions (Signed)
 Patient owes $64.00 for power steps insole 07/29/2024 at check out

## 2024-08-12 ENCOUNTER — Ambulatory Visit: Admitting: Podiatry

## 2024-08-13 ENCOUNTER — Ambulatory Visit: Admitting: Podiatry

## 2024-08-13 DIAGNOSIS — G5782 Other specified mononeuropathies of left lower limb: Secondary | ICD-10-CM

## 2024-08-13 MED ORDER — TRIAMCINOLONE ACETONIDE 40 MG/ML IJ SUSP
40.0000 mg | Freq: Once | INTRAMUSCULAR | Status: AC
  Administered 2024-08-13: 40 mg

## 2024-08-13 NOTE — Progress Notes (Signed)
 Patient presents follow-up injection neuroma third IMS left.  Doing about 50% better.  Not nearly as sharply painful as before.   Physical exam:  General appearance: Pleasant, and in no acute distress. AOx3.  Vascular: Pedal pulses: DP 2/4 bilaterally, PT 2/4 bilaterally. Mild edema lower legs bilaterally. Capillary fill time immediate bilaterally.  Neurological: Positive Mulder sign third IMS left  Dermatologic:   Skin normal temperature bilaterally.  Skin normal color, tone, and texture bilaterally.   Musculoskeletal: Tenderness third IMS left no tenderness with range of motion or palpation of the lesser MTPs.   Diagnosis: 1.  Neuroma third plantar common digital nerve left  Plan: -Doing much better we will do a second injection given the improvement with the first 1 I think she will do fine with this.  Told her to give it 3 or 4 weeks if it still bothering her then call us  for another appointment and we can try third injection. -injected 3cc 2:1 mixture 0.5 cc Marcaine : Triamcinolone  40mg /64ml at third plantar common digital nerve neuroma left.    Return as needed
